# Patient Record
Sex: Male | Born: 1970
Health system: Southern US, Community
[De-identification: ages and names within clinical notes are randomized; demographics above are authoritative.]

## PROBLEM LIST (undated history)

## (undated) DIAGNOSIS — E222 Syndrome of inappropriate secretion of antidiuretic hormone: Secondary | ICD-10-CM

## (undated) DIAGNOSIS — E871 Hypo-osmolality and hyponatremia: Secondary | ICD-10-CM

## (undated) DIAGNOSIS — F17200 Nicotine dependence, unspecified, uncomplicated: Secondary | ICD-10-CM

## (undated) DIAGNOSIS — I10 Essential (primary) hypertension: Secondary | ICD-10-CM

## (undated) HISTORY — DX: Syndrome of inappropriate secretion of antidiuretic hormone: E22.2

## (undated) HISTORY — DX: Hypo-osmolality and hyponatremia: E87.1

## (undated) HISTORY — DX: Nicotine dependence, unspecified, uncomplicated: F17.200

## (undated) HISTORY — DX: Essential (primary) hypertension: I10

---

## 2003-04-20 ENCOUNTER — Emergency Department (HOSPITAL_COMMUNITY): Admission: EM | Admit: 2003-04-20 | Discharge: 2003-04-20 | Payer: Self-pay | Admitting: Emergency Medicine

## 2015-10-17 ENCOUNTER — Ambulatory Visit (INDEPENDENT_AMBULATORY_CARE_PROVIDER_SITE_OTHER): Payer: 59 | Admitting: Physician Assistant

## 2015-10-17 ENCOUNTER — Encounter: Payer: Self-pay | Admitting: Physician Assistant

## 2015-10-17 DIAGNOSIS — Z1211 Encounter for screening for malignant neoplasm of colon: Secondary | ICD-10-CM | POA: Insufficient documentation

## 2015-10-17 DIAGNOSIS — Z720 Tobacco use: Secondary | ICD-10-CM | POA: Diagnosis not present

## 2015-10-17 DIAGNOSIS — Z716 Tobacco abuse counseling: Secondary | ICD-10-CM | POA: Diagnosis not present

## 2015-10-17 DIAGNOSIS — F17200 Nicotine dependence, unspecified, uncomplicated: Secondary | ICD-10-CM

## 2015-10-17 MED ORDER — VARENICLINE TARTRATE 0.5 MG PO TABS: 0.5000 mg | ORAL_TABLET | Freq: Two times a day (BID) | ORAL | 0 refills | Status: DC

## 2015-10-17 MED ORDER — VARENICLINE TARTRATE 1 MG PO TABS: 1.0000 mg | ORAL_TABLET | Freq: Two times a day (BID) | ORAL | 3 refills | Status: DC

## 2015-10-17 NOTE — Progress Notes (Signed)
Patient ID: Patrick MallickDavid L Ramsey MRN: [id number], DOB: 1970/03/23, 45 y.o. Date of Encounter: 10/17/2015, 8:59 AM    Chief Complaint:  Chief Complaint  Patient presents with  . Establish Care    would like to stop smoking PHQ score-0     HPI: 45 y.o. year old white male presents with above.   Says he hasn't seen a doctor in over 10 years. He has no known chronic medical problems and takes no medications. Says that he came in today because he wants to quit smoking. Started smoking around age 45. For a while smoked about a pack a day but currently is 1-1/2 packs a day.  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 Says that one thing that will help him quit smoking is the fact that he cannot smoke at work and has to go to the smoking area. Says says that he already has bought some toothpicks and candy to help him. He has used no medications to help him quit. No type of nicotine replacement etc.  No other complaints or concerns today. Discussed doing a complete physical exam--- he does not have time in his schedule to do it today. To consider returning for complete physical exam and come fasting if so.  Did discuss family medical history briefly. His mother is living but does have a history of uterine cancer. Father is living but does have hypertension and borderline diabetes. No CAD.     Home Meds:   No outpatient prescriptions prior to visit.   No facility-administered medications prior to visit.     Allergies: Not on File    Review of Systems: See HPI for pertinent ROS. All other ROS negative.    Physical Exam: Blood pressure 130/86, pulse 88, temperature 97.7 F (36.5 C), temperature source Oral, resp. rate 16, height 5\' 10"  (1.778 m), weight 171 lb (77.6 kg)., Body mass index is 24.54 kg/m. General:  Appears in no acute distress. Neck: Supple. No thyromegaly. No lymphadenopathy. Lungs: Clear bilaterally to auscultation without wheezes, rales, or rhonchi. Breathing is  unlabored. Heart: Regular rhythm. No murmurs, rubs, or gallops. Msk:  Strength and tone normal for age. Extremities/Skin: Warm and dry. Neuro: Alert and oriented X 3. Moves all extremities spontaneously. Gait is normal. CNII-XII grossly in tact. Psych:  Responds to questions appropriately with a normal affect.     ASSESSMENT AND PLAN:  45 y.o. year old male with  1. Smoker - varenicline (CHANTIX) 0.5 MG tablet; Take 1 tablet (0.5 mg total) by mouth 2 (two) times daily.  Dispense: 60 tablet; Refill: 0 - varenicline (CHANTIX CONTINUING MONTH PAK) 1 MG tablet; Take 1 tablet (1 mg total) by mouth 2 (two) times daily.  Dispense: 60 tablet; Refill: 3  2. Encounter for smoking cessation counseling - varenicline (CHANTIX) 0.5 MG tablet; Take 1 tablet (0.5 mg total) by mouth 2 (two) times daily.  Dispense: 60 tablet; Refill: 0 - varenicline (CHANTIX CONTINUING MONTH PAK) 1 MG tablet; Take 1 tablet (1 mg total) by mouth 2 (two) times daily.  Dispense: 60 tablet; Refill: 3  Is to use the starting dose pack first then go up to the continuing Dosepaks. If he develops any adverse effects he is to stop the medication and call us immediately. Also I have given and reviewed handout with tips for cessation today and told him to read through these and see which of these may help him as well.  He is going to consider returning for complete  physical exam to check screening labs and immunizations etc.  Signed, [address] [place], [place], [facility] 10/17/2015 8:59 AM

## 2015-10-18 MED FILL — CHANTIX 0.5 MG TABLET: 0.5 | 30 days supply | Qty: 60 | Fill #0

## 2015-11-09 ENCOUNTER — Encounter: Payer: Self-pay | Admitting: Physician Assistant

## 2015-11-09 ENCOUNTER — Ambulatory Visit (INDEPENDENT_AMBULATORY_CARE_PROVIDER_SITE_OTHER): Payer: 59 | Admitting: Physician Assistant

## 2015-11-09 VITALS — BP 124/86 | HR 82 | Temp 97.7°F | Resp 16 | Wt 174.0 lb

## 2015-11-09 DIAGNOSIS — M79601 Pain in right arm: Secondary | ICD-10-CM

## 2015-11-09 MED ORDER — MELOXICAM 7.5 MG PO TABS: 7.5000 mg | ORAL_TABLET | Freq: Every day | ORAL | 0 refills | Status: DC

## 2015-11-09 MED FILL — MELOXICAM 7.5 MG TABLET: 7.5 | 30 days supply | Qty: 30 | Fill #0

## 2015-11-09 NOTE — Progress Notes (Signed)
Patient ID: Patrick Ramsey MRN: [id number], DOB: May 20, 1970, 45 y.o. Date of Encounter: 11/09/2015, 12:57 PM    Chief Complaint:  Chief Complaint  Patient presents with  . 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 Says that his right arm around the elbow area has been hurting for 1-1/2 months. Says that he was at the gym lifting weights and thought he had just pulled something as far as just pulling a muscle but says it still hurts so he decided to come get it evaluated. Says he only feels the discomfort with certain movements. Says that when he goes to lift the tailgate on his truck-- he feels a pain at the posterior aspect of the elbow. Points towards the olecranon process and just above this as the area where he feels discomfort at that time. Says that when he is doing movements such as using a screwdriver and having to do that motion against resistance--- at that time he feels discomfort in the anterior lateral aspect of the proximal forearm, up near (but medial to) the lateral epicondyle.  No pain when sitting at rest. No other complaints or concerns.     Home Meds:   Outpatient Medications Prior to Visit  Medication Sig Dispense Refill  . varenicline (CHANTIX CONTINUING MONTH PAK) 1 MG tablet Take 1 tablet (1 mg total) by mouth 2 (two) times daily. 60 tablet 3  . varenicline (CHANTIX) 0.5 MG tablet Take 1 tablet (0.5 mg total) by mouth 2 (two) times daily. 60 tablet 0   No facility-administered medications prior to visit.     Allergies: Not on File    Review of Systems: See HPI for pertinent ROS. All other ROS negative.    Physical Exam: Blood pressure 124/86, pulse 82, temperature 97.7 F (36.5 C), resp. rate 16, weight 174 lb (78.9 kg), SpO2 98 %., Body mass index is 24.97 kg/m. General: WNWD WM.  Appears in no acute distress. Neck: Supple. No thyromegaly. No lymphadenopathy. Lungs: Clear bilaterally  to auscultation without wheezes, rales, or rhonchi. Breathing is unlabored. Heart: Regular rhythm. No murmurs, rubs, or gallops. Msk:  Strength and tone normal for age. Right Elbow: Inspection is normal. Range of motion is normal. There is no tenderness with palpation to the olecranon process. There is no tenderness with palpation of the lateral epicondyle. There is no tenderness with palpation of the medial epicondyle. As well a palpated the other areas where he points to as the areas of discomfort with certain movements but there is no tenderness with palpation of any of these regions. Had him extend the arm and hyperextend the wrist and middle finger against resistance but this still causes no discomfort in the lateral epicondyle. Extremities/Skin: Warm and dry.  Neuro: Alert and oriented X 3. Moves all extremities spontaneously. Gait is normal. CNII-XII grossly in tact. Psych:  Responds to questions appropriately with a normal affect.     ASSESSMENT AND PLAN:  45 y.o. year old male with  1. Right arm pain --secondary to tendonitis and muscle strain He is to rest the right elbow and avoid using this is much as possible. He is to take metabolic daily with food for 2 - 3 weeks.  F/U if does not improve.  - meloxicam (MOBIC) 7.5 MG tablet; Take 1 tablet (7.5 mg total) by mouth daily.  Dispense: 30 tablet; Refill: 0   Signed,  [address] [place], [place], [place] 11/09/2015 12:57 PM

## 2015-11-11 MED FILL — CHANTIX 1 MG TABLET: 1 | 30 days supply | Qty: 60 | Fill #0

## 2016-03-12 ENCOUNTER — Ambulatory Visit (HOSPITAL_COMMUNITY)
Admission: RE | Admit: 2016-03-12 | Discharge: 2016-03-12 | Disposition: A | Discharge status: Home / Self Care | Payer: 59 | Source: Ambulatory Visit | Attending: Physician Assistant | Admitting: Physician Assistant

## 2016-03-12 ENCOUNTER — Encounter: Payer: Self-pay | Admitting: Physician Assistant

## 2016-03-12 ENCOUNTER — Ambulatory Visit (INDEPENDENT_AMBULATORY_CARE_PROVIDER_SITE_OTHER): Payer: 59 | Admitting: Physician Assistant

## 2016-03-12 VITALS — BP 124/80 | HR 93 | Temp 97.5°F | Resp 16 | Wt 170.4 lb

## 2016-03-12 DIAGNOSIS — N50811 Right testicular pain: Secondary | ICD-10-CM

## 2016-03-12 DIAGNOSIS — N451 Epididymitis: Secondary | ICD-10-CM

## 2016-03-12 DIAGNOSIS — N5089 Other specified disorders of the male genital organs: Secondary | ICD-10-CM

## 2016-03-12 DIAGNOSIS — N433 Hydrocele, unspecified: Secondary | ICD-10-CM | POA: Insufficient documentation

## 2016-03-12 MED ORDER — CEFTRIAXONE SODIUM 250 MG IJ SOLR
250.0000 mg | Freq: Once | INTRAMUSCULAR | Status: AC
  Administered 2016-03-12: 250 mg via INTRAMUSCULAR

## 2016-03-12 MED ORDER — DOXYCYCLINE HYCLATE 100 MG PO TABS: 100.0000 mg | ORAL_TABLET | Freq: Two times a day (BID) | ORAL | 0 refills | Status: DC

## 2016-03-12 MED FILL — DOXYCYCLINE HYCLATE 100 MG: 100 | 10 days supply | Qty: 20 | Fill #0

## 2016-03-12 NOTE — Progress Notes (Signed)
Patient ID: Patrick Ramsey MRN: [id number], DOB: 1970/02/18, 46 y.o. Date of Encounter: 03/12/2016, 11:09 AM    Chief Complaint:  Chief Complaint  Patient presents with  . right testicular swollen    x4days/ worked out Wednesday night     HPI: 45 y.o. year old male presents with above.   He states that he exercised Wednesday night and felt okay during that exercise and noticed no symptoms during that time. Says that on Thursday night he went to work and noticed that he felt sore in the right groin region. At time his right testicle also felt a little bit tender. This weekend his right testicle became swollen and right testicle became really sore. Has been taking some Tylenol. Looked it up on the Internet and read that it could be caused by infection--so he started taking amoxicillin (that he had some antibiotic amoxicillin at home left from her prior infection)--so he took amoxicillin---  has completed what he had of the amoxicillin. Right testicle still swollen and painful. No fevers or chills.  Has seen no blood. No penile discharge. Reports that he is not married.      Home Meds:   Outpatient Medications Prior to Visit  Medication Sig Dispense Refill  . meloxicam (MOBIC) 7.5 MG tablet Take 1 tablet (7.5 mg total) by mouth daily. (Patient not taking: Reported on 03/12/2016) 30 tablet 0  . varenicline (CHANTIX CONTINUING MONTH PAK) 1 MG tablet Take 1 tablet (1 mg total) by mouth 2 (two) times daily. (Patient not taking: Reported on 03/12/2016) 60 tablet 3  . varenicline (CHANTIX) 0.5 MG tablet Take 1 tablet (0.5 mg total) by mouth 2 (two) times daily. (Patient not taking: Reported on 03/12/2016) 60 tablet 0   No facility-administered medications prior to visit.     Allergies: No Known Allergies    Review of Systems: See HPI for pertinent ROS. All other ROS negative.    Physical Exam: Blood pressure 124/80, pulse 93, temperature 97.5 F (36.4 C), temperature source Oral, resp. rate  16, weight 170 lb 6.4 oz (77.3 kg), SpO2 99 %., Body mass index is 24.45 kg/m. General:  WNWD WM. Appears in no acute distress. Neck: Supple. No thyromegaly. No lymphadenopathy. Lungs: Clear bilaterally to auscultation without wheezes, rales, or rhonchi. Breathing is unlabored. Heart: Regular rhythm. No murmurs, rubs, or gallops. Msk:  Strength and tone normal for age. Extremities/Skin: Warm and dry. No rashes. Testicular Exam: Patrick Ramsey, CMA present as chaperone during exam: He reports tenderness with palpation of entire right testicle--posterior aspect, anterior aspect, sides. Right testicle with minimal swolling, minimal erythema. No lesion seen on inspection. No other abnormality detected on exam.  Neuro: Alert and oriented X 3. Moves all extremities spontaneously. Gait is normal. CNII-XII grossly in tact. Psych:  Responds to questions appropriately with a normal affect.     ASSESSMENT AND PLAN:  46 y.o. year old male with   1. Epididymitis --Rocephin 250mg  IM - doxycycline (VIBRA-TABS) 100 MG tablet; Take 1 tablet (100 mg total) by mouth 2 (two) times daily.  Dispense: 20 tablet; Refill: 0  2. Right testicular pain - US Scrotum; Future - [place] Art/Ven Flow Abd Pelv Doppler; Future  3. Swelling of right testicle - US Scrotum; Future - [place] Art/Ven Flow Abd Pelv Doppler; Future  Pt was sent directly for scrotum ultrasound test.   Results already available and I have called patient and spoken with him directly and informed him of results. He is coming directly  to the office for injection of Rocephin 250 mg IM.  As well---he will take oral doxycycline 100 mg twice a day 10 days.  He has come into the office. I have discussed results with him directly. Discussed with him that this usually is caused by sexual transmitted disease. I am treating him with medications to cover these. However, he needs to inform any recent sexual partners and they need to be evaluated and treated. He is  to avoid any sexual contact until he has completed his treatment and they have completed their treatment. He voices understanding and agrees. Given the Rocephin 250 mg IM here in the office and a prescription for the doxycycline is sent to the pharmacy and he is aware to take as directed and complete all of it.  Signed, [address]Patrick Ramsey, [place], [facility] 03/12/2016 11:09 AM

## 2016-03-12 NOTE — Addendum Note (Signed)
Addended by: Phineas SemenJOHNSON, TIFFANY A on: 03/12/2016 05:06 PM   Modules accepted: Orders

## 2016-07-23 ENCOUNTER — Ambulatory Visit (INDEPENDENT_AMBULATORY_CARE_PROVIDER_SITE_OTHER): Payer: 59 | Admitting: Physician Assistant

## 2016-07-23 ENCOUNTER — Encounter: Payer: Self-pay | Admitting: Physician Assistant

## 2016-07-23 VITALS — BP 122/86 | HR 93 | Temp 97.5°F | Resp 16 | Ht 70.0 in | Wt 170.2 lb

## 2016-07-23 DIAGNOSIS — N529 Male erectile dysfunction, unspecified: Secondary | ICD-10-CM | POA: Diagnosis not present

## 2016-07-23 NOTE — Progress Notes (Signed)
    Patient ID: Patrick MallickDavid L Ramsey MRN: [id number], DOB: 08/21/1970, 46 y.o. Date of Encounter: 07/23/2016, 4:55 PM    Chief Complaint:  Chief Complaint  Patient presents with  . Erectile Dysfunction    x832months      HPI: 46 y.o. year old male presents with above.   He states that he has been having some erectile dysfunction that has been gradually worsening over the approximate past 6 months. He states that he has never had this evaluated and has never had any medication for this. Asked if he has any known history of high blood pressure high cholesterol diabetes or history of low testosterone. He states that he has never had any of these checked. No complete physical exam no lab work. He is not fasting today. He works third shift so works nights but states that he is off this upcoming weekend can come in this Monday morning fasting and for visit. [name] scheduling that visit as a complete physical exam and he is agreeable with this plan and this approach. No other concerns to address today.     Home Meds:   Outpatient Medications Prior to Visit  Medication Sig Dispense Refill  . doxycycline (VIBRA-TABS) 100 MG tablet Take 1 tablet (100 mg total) by mouth 2 (two) times daily. 20 tablet 0  . meloxicam (MOBIC) 7.5 MG tablet Take 1 tablet (7.5 mg total) by mouth daily. (Patient not taking: Reported on 03/12/2016) 30 tablet 0  . varenicline (CHANTIX CONTINUING MONTH PAK) 1 MG tablet Take 1 tablet (1 mg total) by mouth 2 (two) times daily. (Patient not taking: Reported on 03/12/2016) 60 tablet 3  . varenicline (CHANTIX) 0.5 MG tablet Take 1 tablet (0.5 mg total) by mouth 2 (two) times daily. (Patient not taking: Reported on 03/12/2016) 60 tablet 0   No facility-administered medications prior to visit.     Allergies: No Known Allergies    Review of Systems: See HPI for pertinent ROS. All other ROS negative.    Physical Exam: Blood pressure 122/86, pulse 93, temperature 97.5 F (36.4 C),  temperature source Oral, resp. rate 16, height 5\' 10"  (1.778 m), weight 170 lb 3.2 oz (77.2 kg), SpO2 98 %., Body mass index is 24.42 kg/m. General:  WNWD WM. Appears in no acute distress. Neck: Supple. No thyromegaly. No lymphadenopathy. Lungs: Clear bilaterally to auscultation without wheezes, rales, or rhonchi. Breathing is unlabored. Heart: Regular rhythm. No murmurs, rubs, or gallops. Msk:  Strength and tone normal for age. Extremities/Skin: Warm and dry. Neuro: Alert and oriented X 3. Moves all extremities spontaneously. Gait is normal. CNII-XII grossly in tact. Psych:  Responds to questions appropriately with a normal affect.     ASSESSMENT AND PLAN:  46 y.o. year old male with  1. Erectile dysfunction, unspecified erectile dysfunction type He will return on Monday, June 25 for complete physical exam at 9 AM. He will come fasting to that appointment. At that time I will check screening labs and also testosterone level to further evaluate ED.   [address]igned, [place] Beth [place], [place], [facility] 07/23/2016 4:55 PM

## 2016-07-30 ENCOUNTER — Ambulatory Visit (INDEPENDENT_AMBULATORY_CARE_PROVIDER_SITE_OTHER): Payer: 59 | Admitting: Physician Assistant

## 2016-07-30 ENCOUNTER — Encounter: Payer: Self-pay | Admitting: Physician Assistant

## 2016-07-30 VITALS — BP 130/88 | HR 96 | Temp 97.9°F | Resp 16 | Ht 70.0 in | Wt 170.0 lb

## 2016-07-30 DIAGNOSIS — Z0000 Encounter for general adult medical examination without abnormal findings: Secondary | ICD-10-CM | POA: Diagnosis not present

## 2016-07-30 DIAGNOSIS — N529 Male erectile dysfunction, unspecified: Secondary | ICD-10-CM | POA: Diagnosis not present

## 2016-07-30 DIAGNOSIS — F17200 Nicotine dependence, unspecified, uncomplicated: Secondary | ICD-10-CM | POA: Diagnosis not present

## 2016-07-30 LAB — CBC WITH DIFFERENTIAL/PLATELET
BASOS ABS: 48 {cells}/uL (ref 0–200)
Basophils Relative: 1 %
EOS ABS: 96 {cells}/uL (ref 15–500)
Eosinophils Relative: 2 %
HEMATOCRIT: 50 % (ref 38.5–50.0)
Hemoglobin: 17.4 g/dL — ABNORMAL HIGH (ref 13.0–17.0)
Lymphocytes Relative: 30 %
Lymphs Abs: 1440 cells/uL (ref 850–3900)
MCH: 31.9 pg (ref 27.0–33.0)
MCHC: 34.8 g/dL (ref 32.0–36.0)
MCV: 91.7 fL (ref 80.0–100.0)
MONO ABS: 528 {cells}/uL (ref 200–950)
MONOS PCT: 11 %
MPV: 9.5 fL (ref 7.5–12.5)
NEUTROS ABS: 2688 {cells}/uL (ref 1500–7800)
Neutrophils Relative %: 56 %
PLATELETS: 244 10*3/uL (ref 140–400)
RBC: 5.45 MIL/uL (ref 4.20–5.80)
RDW: 13.4 % (ref 11.0–15.0)
WBC: 4.8 10*3/uL (ref 3.8–10.8)

## 2016-07-30 LAB — COMPLETE METABOLIC PANEL WITH GFR
ALBUMIN: 4.6 g/dL (ref 3.6–5.1)
ALK PHOS: 90 U/L (ref 40–115)
ALT: 33 U/L (ref 9–46)
AST: 29 U/L (ref 10–40)
BILIRUBIN TOTAL: 0.5 mg/dL (ref 0.2–1.2)
BUN: 11 mg/dL (ref 7–25)
CALCIUM: 10 mg/dL (ref 8.6–10.3)
CO2: 23 mmol/L (ref 20–31)
CREATININE: 1.2 mg/dL (ref 0.60–1.35)
Chloride: 100 mmol/L (ref 98–110)
GFR, EST AFRICAN AMERICAN: 85 mL/min (ref >=60)
GFR, EST NON AFRICAN AMERICAN: 73 mL/min (ref >=60)
Glucose, Bld: 96 mg/dL (ref 70–99)
Potassium: 5 mmol/L (ref 3.5–5.3)
Sodium: 135 mmol/L (ref 135–146)
TOTAL PROTEIN: 7.2 g/dL (ref 6.1–8.1)

## 2016-07-30 LAB — LIPID PANEL
CHOLESTEROL: 215 mg/dL — AB (ref <200)
HDL: 53 mg/dL (ref >40)
LDL Cholesterol: 139 mg/dL — ABNORMAL HIGH (ref <100)
TRIGLYCERIDES: 113 mg/dL (ref <150)
Total CHOL/HDL Ratio: 4.1 Ratio (ref <5.0)
VLDL: 23 mg/dL (ref <30)

## 2016-07-30 LAB — TSH: TSH: 1.14 mIU/L (ref 0.40–4.50)

## 2016-07-30 MED ORDER — SILDENAFIL CITRATE 100 MG PO TABS: 50.0000 mg | ORAL_TABLET | Freq: Every day | ORAL | 11 refills | Status: DC | PRN

## 2016-07-30 MED FILL — SILDENAFIL 100 MG TABLET: 100 | 30 days supply | Qty: 6 | Fill #0
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
## 2016-07-31 LAB — TESTOSTERONE: Testosterone: 755 ng/dL (ref 250–827)

## 2016-09-04 MED FILL — SILDENAFIL 100 MG TABLET: 100 | 30 days supply | Qty: 6 | Fill #1

## 2016-10-10 MED FILL — SILDENAFIL CITRATE 100 MG T: 100 | 30 days supply | Qty: 6 | Fill #2

## 2016-11-12 MED FILL — SILDENAFIL CITRATE 100 MG T: 100 | 30 days supply | Qty: 6 | Fill #3

## 2016-12-06 MED FILL — SILDENAFIL CITRATE 100 MG T: 100 | 30 days supply | Qty: 6 | Fill #4

## 2017-01-11 MED FILL — SILDENAFIL CITRATE 100 MG T: 100 | 30 days supply | Qty: 6 | Fill #5

## 2017-02-07 MED FILL — SILDENAFIL CITRATE 100 MG T: 100 | 30 days supply | Qty: 6 | Fill #6

## 2017-03-13 MED FILL — SILDENAFIL CITRATE 100 MG T: 100 | 30 days supply | Qty: 6 | Fill #7

## 2017-04-15 MED FILL — SILDENAFIL CITRATE 100 MG T: 100 | 30 days supply | Qty: 6 | Fill #8

## 2017-05-15 MED FILL — SILDENAFIL CITRATE 100 MG T: 100 | 30 days supply | Qty: 6 | Fill #9

## 2017-06-21 ENCOUNTER — Other Ambulatory Visit: Payer: Self-pay | Admitting: Physician Assistant

## 2017-06-21 DIAGNOSIS — N529 Male erectile dysfunction, unspecified: Secondary | ICD-10-CM

## 2017-06-21 MED FILL — SILDENAFIL CITRATE 100 MG T: 100 | 30 days supply | Qty: 6 | Fill #0

## 2017-06-26 ENCOUNTER — Ambulatory Visit (INDEPENDENT_AMBULATORY_CARE_PROVIDER_SITE_OTHER): Payer: No Typology Code available for payment source | Admitting: Family Medicine

## 2017-06-26 ENCOUNTER — Encounter: Payer: Self-pay | Admitting: Family Medicine

## 2017-06-26 ENCOUNTER — Other Ambulatory Visit: Payer: Self-pay

## 2017-06-26 VITALS — BP 120/70 | HR 81 | Temp 97.5°F | Ht 70.0 in | Wt 177.0 lb

## 2017-06-26 DIAGNOSIS — H00011 Hordeolum externum right upper eyelid: Secondary | ICD-10-CM | POA: Diagnosis not present

## 2017-06-26 DIAGNOSIS — J019 Acute sinusitis, unspecified: Secondary | ICD-10-CM | POA: Diagnosis not present

## 2017-06-26 MED ORDER — ERYTHROMYCIN 5 MG/GM OP OINT: TOPICAL_OINTMENT | OPHTHALMIC | 0 refills | Status: DC

## 2017-06-26 MED ORDER — MOMETASONE FUROATE 50 MCG/ACT NA SUSP: 2.0000 | Freq: Every day | NASAL | 12 refills | Status: DC

## 2017-06-26 MED FILL — MOMETASONE FUROATE 50 MCG S: 50 | 30 days supply | Qty: 17 | Fill #0

## 2017-06-26 MED FILL — ERYTHROMYCIN 0.5% EYE OINT: 5 | 7 days supply | Qty: 1 | Fill #0

## 2017-06-26 NOTE — Progress Notes (Signed)
Patient ID: Patrick Ramsey, male    DOB: 1970-11-16, 47 y.o.   MRN: [id number]  PCP: Dorena Bodo, PA-C  Chief Complaint  Patient presents with  . Facial Swelling    Patient in with c/o eye swelling and soreness.    Subjective:   Patrick Ramsey is a 47 y.o. male, presents to clinic with CC of right upper eyelid swelling x 3 weeks that gradually improved, but not yet resolved.  It started with a bump to the outside upper right eyelid and gradually had swelling across the whole upper lid.  He only has mild discomfort to the inner right eye if he presses on it, mild discomfort, feels "full" or inflammed.  He used OTC red eye drops and allergy eye drops w/o any change, and only had upper eyelid swelling improve after using someones steroid eye drops.  The small bump to the He only put in 2 drops into his right eye on two separate occassions this past week.  No associated eye redness, eye discharge or crusting, blurry vision, photophobia, HA, nasal congestion/discharge, sneezing, itching, rash.  Patient Active Problem List   Diagnosis Date Noted  . Smoker 10/17/2015  . Encounter for smoking cessation counseling 10/17/2015     Prior to Admission medications   Medication Sig Start Date End Date Taking? Authorizing Provider  sildenafil (VIAGRA) 100 MG tablet TAKE 1/2 TO 1 TABLET (50-100 MG TOTAL) BY MOUTH DAILY AS NEEDED FOR ERECTILE DYSFUNCTION. 06/21/17  Yes Allayne Butcher B, PA-C     No Known Allergies   Family History  Problem Relation Age of Onset  . Uterine cancer Mother   . Hypertension Father      Social History   Socioeconomic History  . Marital status: Married    Spouse name: Not on file  . Number of children: Not on file  . Years of education: Not on file  . Highest education level: Not on file  Occupational History  . Not on file  Social Needs  . Financial resource strain: Not on file  . Food insecurity:    Worry: Not on file    Inability: Not on file  .  Transportation needs:    Medical: Not on file    Non-medical: Not on file  Tobacco Use  . Smoking status: Current Every Day Smoker    Packs/day: 1.50    Years: 27.00    Pack years: 40.50    Types: Cigarettes    Start date: 10/17/1986  . Smokeless tobacco: Never Used  Substance and Sexual Activity  . Alcohol use: Yes    Alcohol/week: 7.2 oz    Types: 12 Cans of beer per week    Comment: 12 cans on weekend  . Drug use: No  . Sexual activity: Yes  Lifestyle  . Physical activity:    Days per week: Not on file    Minutes per session: Not on file  . Stress: Not on file  Relationships  . Social connections:    Talks on phone: Not on file    Gets together: Not on file    Attends religious service: Not on file    Active member of club or organization: Not on file    Attends meetings of clubs or organizations: Not on file    Relationship status: Not on file  . Intimate partner violence:    Fear of current or ex partner: Not on file    Emotionally abused: Not on file  Physically abused: Not on file    Forced sexual activity: Not on file  Other Topics Concern  . Not on file  Social History Narrative  . Not on file     Review of Systems  Constitutional: Negative.  Negative for activity change, chills, diaphoresis, fatigue and fever.  HENT: Negative.   Eyes: Negative for photophobia, discharge, redness, itching and visual disturbance.  Respiratory: Negative.  Negative for cough.   Musculoskeletal: Negative.   Skin: Negative for pallor and rash.  Allergic/Immunologic: Negative for environmental allergies and immunocompromised state.  Neurological: Negative.   All other systems reviewed and are negative.      Objective:    Vitals:   06/26/17 0854  BP: 120/70  Pulse: 81  Temp: (!) 97.5 F (36.4 C)  TempSrc: Oral  SpO2: 99%  Weight: 177 lb (80.3 kg)  Height:  (1.778 m)      Physical Exam  Constitutional: He appears well-developed and well-nourished. No  distress.  HENT:  Head: Normocephalic and atraumatic. Head is without right periorbital erythema and without left periorbital erythema.  Right Ear: Tympanic membrane, external ear and ear canal normal.  Left Ear: Tympanic membrane, external ear and ear canal normal.  Nose: Mucosal edema and rhinorrhea present. No sinus tenderness. Right sinus exhibits no maxillary sinus tenderness and no frontal sinus tenderness. Left sinus exhibits no maxillary sinus tenderness and no frontal sinus tenderness.  Mouth/Throat: Uvula is midline and mucous membranes are normal. No trismus in the jaw. No uvula swelling. Posterior oropharyngeal erythema present. No oropharyngeal exudate, posterior oropharyngeal edema or tonsillar abscesses.  Eyes: Pupils are equal, round, and reactive to light. Conjunctivae and EOM are normal. Lids are everted and swept, no foreign bodies found. Right eye exhibits hordeolum. Right eye exhibits no chemosis, no discharge and no exudate. No foreign body present in the right eye. Left eye exhibits no chemosis, no discharge, no exudate and no hordeolum. No foreign body present in the left eye. Right conjunctiva is not injected. Right conjunctiva has no hemorrhage. Left conjunctiva is not injected. Left conjunctiva has no hemorrhage. No scleral icterus. Right eye exhibits normal extraocular motion and no nystagmus. Left eye exhibits normal extraocular motion and no nystagmus.  0.5 cm diameter circular soft nodule to right upper lateral eyelid, non-tender to palpation, no surrounding edema, erythema, induration, tenderness to right upper eye lid ttp to right medial canthus, no edema, no discharge, no erythema, no drainage  Right lower, left upper and left lower eyelids normal  Neck: Normal range of motion. No tracheal deviation present.  Cardiovascular: Normal rate and regular rhythm.  Pulmonary/Chest: Effort normal. No stridor. No respiratory distress.  Musculoskeletal: Normal range of motion.    Lymphadenopathy:    He has no cervical adenopathy.  Neurological: He is alert. He exhibits normal muscle tone. Coordination normal.  Skin: Skin is warm and dry. No rash noted. He is not diaphoretic. No erythema.  Psychiatric: He has a normal mood and affect. His behavior is normal.  Nursing note and vitals reviewed.         Assessment & Plan:      ICD-10-CM   1. Hordeolum of right upper eyelid, unspecified hordeolum type H00.011 erythromycin ophthalmic ointment  2. Acute sinusitis, recurrence not specified, unspecified location J01.90 mometasone (NASONEX) 50 MCG/ACT nasal spray    Likely a simple hordeolum, no concern for orbital cellulitis, no concern for eye infections.  Will have pt do warm compresses 2-3 x a day, gently exfoliate eyelashes  and upper eyelid, apply antibiotic ointment to inner margin of right upper eyelid and lash line for 5-7 days.  F/up if not improved.  Also has likely some baseline seasonal allergies with nasal edema and mild erythema, will tx with nasonex.   Danelle Berry, PA-C 06/27/17 11:06 PM

## 2017-06-26 NOTE — Patient Instructions (Signed)
Warm soaks for 10-15 minutes, 2 x a day  Gentle exfoliation of eyelashes 2 x a day

## 2017-07-17 MED FILL — SILDENAFIL CITRATE 100 MG T: 100 | 30 days supply | Qty: 6 | Fill #1

## 2017-08-01 ENCOUNTER — Other Ambulatory Visit: Payer: Self-pay

## 2017-08-01 ENCOUNTER — Other Ambulatory Visit: Payer: Self-pay | Admitting: Family Medicine

## 2017-08-01 ENCOUNTER — Ambulatory Visit (INDEPENDENT_AMBULATORY_CARE_PROVIDER_SITE_OTHER): Payer: No Typology Code available for payment source | Admitting: Physician Assistant

## 2017-08-01 ENCOUNTER — Encounter: Payer: Self-pay | Admitting: Physician Assistant

## 2017-08-01 VITALS — BP 130/80 | HR 67 | Temp 97.8°F | Resp 16 | Ht 68.0 in | Wt 181.0 lb

## 2017-08-01 DIAGNOSIS — Z0000 Encounter for general adult medical examination without abnormal findings: Secondary | ICD-10-CM

## 2017-08-01 DIAGNOSIS — S83102A Unspecified subluxation of left knee, initial encounter: Secondary | ICD-10-CM | POA: Diagnosis not present

## 2017-08-01 DIAGNOSIS — F17200 Nicotine dependence, unspecified, uncomplicated: Secondary | ICD-10-CM

## 2017-08-01 DIAGNOSIS — Z716 Tobacco abuse counseling: Secondary | ICD-10-CM | POA: Diagnosis not present
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
## 2017-08-12 ENCOUNTER — Other Ambulatory Visit: Payer: No Typology Code available for payment source

## 2017-08-12 DIAGNOSIS — Z0000 Encounter for general adult medical examination without abnormal findings: Secondary | ICD-10-CM

## 2017-08-12 LAB — CBC WITH DIFFERENTIAL/PLATELET
Basophils Absolute: 68 cells/uL (ref 0–200)
Basophils Relative: 1 %
EOS PCT: 1.9 %
Eosinophils Absolute: 129 cells/uL (ref 15–500)
HCT: 47.2 % (ref 38.5–50.0)
Hemoglobin: 16.6 g/dL (ref 13.2–17.1)
Lymphs Abs: 1496 cells/uL (ref 850–3900)
MCH: 31.6 pg (ref 27.0–33.0)
MCHC: 35.2 g/dL (ref 32.0–36.0)
MCV: 89.7 fL (ref 80.0–100.0)
MPV: 9.9 fL (ref 7.5–12.5)
Monocytes Relative: 11.7 %
NEUTROS PCT: 63.4 %
Neutro Abs: 4311 cells/uL (ref 1500–7800)
Platelets: 268 10*3/uL (ref 140–400)
RBC: 5.26 10*6/uL (ref 4.20–5.80)
RDW: 12.2 % (ref 11.0–15.0)
Total Lymphocyte: 22 %
WBC mixed population: 796 cells/uL (ref 200–950)
WBC: 6.8 10*3/uL (ref 3.8–10.8)

## 2017-08-12 LAB — COMPREHENSIVE METABOLIC PANEL
AG Ratio: 1.9 (calc) (ref 1.0–2.5)
ALBUMIN MSPROF: 4.5 g/dL (ref 3.6–5.1)
ALKALINE PHOSPHATASE (APISO): 92 U/L (ref 40–115)
ALT: 29 U/L (ref 9–46)
AST: 30 U/L (ref 10–40)
BUN: 9 mg/dL (ref 7–25)
CALCIUM: 9.5 mg/dL (ref 8.6–10.3)
CHLORIDE: 97 mmol/L — AB (ref 98–110)
CO2: 25 mmol/L (ref 20–32)
CREATININE: 1.06 mg/dL (ref 0.60–1.35)
Globulin: 2.4 g/dL (calc) (ref 1.9–3.7)
Glucose, Bld: 104 mg/dL — ABNORMAL HIGH (ref 65–99)
POTASSIUM: 4.9 mmol/L (ref 3.5–5.3)
Sodium: 132 mmol/L — ABNORMAL LOW (ref 135–146)
Total Bilirubin: 0.5 mg/dL (ref 0.2–1.2)
Total Protein: 6.9 g/dL (ref 6.1–8.1)

## 2017-08-12 LAB — LIPID PANEL
Cholesterol: 210 mg/dL — ABNORMAL HIGH (ref <200)
HDL: 63 mg/dL (ref >40)
LDL Cholesterol (Calc): 131 mg/dL (calc) — ABNORMAL HIGH
NON-HDL CHOLESTEROL (CALC): 147 mg/dL — AB (ref <130)
TRIGLYCERIDES: 65 mg/dL (ref <150)
Total CHOL/HDL Ratio: 3.3 (calc) (ref <5.0)

## 2017-08-16 MED FILL — SILDENAFIL CITRATE 100 MG T: 100 | 30 days supply | Qty: 6 | Fill #2

## 2017-08-16 MED FILL — MOMETASONE FUROATE 50 MCG S: 50 | 30 days supply | Qty: 17 | Fill #1

## 2017-08-20 ENCOUNTER — Ambulatory Visit: Payer: No Typology Code available for payment source | Admitting: Orthopaedic Surgery

## 2017-08-20 ENCOUNTER — Encounter: Payer: Self-pay | Admitting: Orthopaedic Surgery

## 2017-08-20 ENCOUNTER — Telehealth: Payer: Self-pay | Admitting: Radiology

## 2017-08-20 ENCOUNTER — Ambulatory Visit (INDEPENDENT_AMBULATORY_CARE_PROVIDER_SITE_OTHER): Payer: No Typology Code available for payment source

## 2017-08-20 VITALS — BP 160/99 | HR 76 | Ht 70.0 in | Wt 181.0 lb

## 2017-08-20 DIAGNOSIS — G8929 Other chronic pain: Secondary | ICD-10-CM | POA: Diagnosis not present

## 2017-08-20 DIAGNOSIS — M25562 Pain in left knee: Secondary | ICD-10-CM

## 2017-08-20 NOTE — Progress Notes (Signed)
Subjective:    Patient ID: Patrick Ramsey, male    DOB: 07-15-1970, 47 y.o.   MRN: [id number]  HPI He has had problems with his left knee for three years.  He has locking of the left knee.  He has to stop what he is doing, then sit, and gently try to move the knee.  It is very painful.  It happens about every three months.  He thought it would get better but it has not.  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  He has swelling of the knee when it happens but the swelling goes away in a few days.  He has tendency for it to buckle at times.  He has not taken anything for it.  He has not had any trauma.   Review of Systems  Constitutional: Positive for activity change.  Musculoskeletal: Positive for arthralgias, gait problem and joint swelling.  All other systems reviewed and are negative.  For Review of Systems, all other systems reviewed and are negative.  History reviewed. No pertinent past medical history.  History reviewed. No pertinent surgical history.  Current Outpatient Medications on File Prior to Visit  Medication Sig Dispense Refill  . mometasone (NASONEX) 50 MCG/ACT nasal spray Place 2 sprays into the nose daily. 17 g 12  . sildenafil (VIAGRA) 100 MG tablet TAKE 1/2 TO 1 TABLET (50-100 MG TOTAL) BY MOUTH DAILY AS NEEDED FOR ERECTILE DYSFUNCTION. 5 tablet 11  . varenicline (CHANTIX) 1 MG tablet Take 1 mg by mouth 2 (two) times daily.     No current facility-administered medications on file prior to visit.     Social History   Socioeconomic History  . Marital status: Married    Spouse name: Not on file  . Number of children: Not on file  . Years of education: Not on file  . Highest education level: Not on file  Occupational History  . Not on file  Social Needs  . Financial resource strain: Not on file  . Food insecurity:    Worry: Not on file    Inability: Not on file  . Transportation needs:    Medical: Not on file    Non-medical: Not  on file  Tobacco Use  . Smoking status: Current Every Day Smoker    Packs/day: 1.50    Years: 27.00    Pack years: 40.50    Types: Cigarettes    Start date: 10/17/1986  . Smokeless tobacco: Never Used  Substance and Sexual Activity  . Alcohol use: Yes    Alcohol/week: 7.2 oz    Types: 12 Cans of beer per week    Comment: 12 cans on weekend  . Drug use: No  . Sexual activity: Yes  Lifestyle  . Physical activity:    Days per week: Not on file    Minutes per session: Not on file  . Stress: Not on file  Relationships  . Social connections:    Talks on phone: Not on file    Gets together: Not on file    Attends religious service: Not on file    Active member of club or organization: Not on file    Attends meetings of clubs or organizations: Not on file    Relationship status: Not on file  . Intimate partner violence:    Fear of current or ex partner: Not on file    Emotionally abused: Not on file    Physically abused: Not  on file    Forced sexual activity: Not on file  Other Topics Concern  . Not on file  Social History Narrative  . Not on file    Family History  Problem Relation Age of Onset  . Uterine cancer Mother   . Hypertension Father     BP (!) 160/99   Pulse 76   Ht 5\' 10"  (1.778 m)   Wt 181 lb (82.1 kg)   BMI 25.97 kg/m   Body mass index is 25.97 kg/m.      Objective:   Physical Exam  Constitutional: He is oriented to person, place, and time. He appears well-developed and well-nourished.  HENT:  Head: Normocephalic and atraumatic.  Eyes: Pupils are equal, round, and reactive to light. Conjunctivae and EOM are normal.  Neck: Normal range of motion. Neck supple.  Cardiovascular: Normal rate, regular rhythm and intact distal pulses.  Pulmonary/Chest: Effort normal.  Abdominal: Soft.  Musculoskeletal:       Left knee: Tenderness found. Medial joint line tenderness noted.       Legs: Neurological: He is alert and oriented to person, place, and  time. He has normal reflexes. He displays normal reflexes. No cranial nerve deficit. He exhibits normal muscle tone. Coordination normal.  Skin: Skin is warm and dry.  Psychiatric: He has a normal mood and affect. His behavior is normal. Judgment and thought content normal.    X-rays were done of the left knee, reported separately.      Assessment & Plan:   Encounter Diagnosis  Name Primary?  . Chronic pain of left knee Yes   I feel he has a tear of the medial meniscus.  He needs MRI and then surgery, arthroscopy.  I will schedule the MRI.  Return after the MRI.  Call if any problem.  Precautions discussed.

## 2017-08-20 NOTE — Telephone Encounter (Signed)
Called for prior auth of MIR scan date specific for Monday July 22nd at 5pm  [id number] is auth number. Patient is aware of the time/date of the MRI

## 2017-08-26 ENCOUNTER — Ambulatory Visit (HOSPITAL_COMMUNITY)
Admission: RE | Admit: 2017-08-26 | Discharge: 2017-08-26 | Disposition: A | Discharge status: Home / Self Care | Payer: No Typology Code available for payment source | Source: Ambulatory Visit | Attending: Orthopaedic Surgery | Admitting: Orthopaedic Surgery

## 2017-08-26 DIAGNOSIS — M25562 Pain in left knee: Secondary | ICD-10-CM

## 2017-08-26 DIAGNOSIS — X58XXXA Exposure to other specified factors, initial encounter: Secondary | ICD-10-CM | POA: Diagnosis not present

## 2017-08-26 DIAGNOSIS — G8929 Other chronic pain: Secondary | ICD-10-CM | POA: Diagnosis present

## 2017-08-26 DIAGNOSIS — S83242A Other tear of medial meniscus, current injury, left knee, initial encounter: Secondary | ICD-10-CM | POA: Diagnosis not present

## 2017-08-28 ENCOUNTER — Ambulatory Visit: Payer: No Typology Code available for payment source | Admitting: Orthopaedic Surgery

## 2017-08-28 ENCOUNTER — Encounter: Payer: Self-pay | Admitting: Orthopaedic Surgery

## 2017-08-28 VITALS — BP 141/78 | HR 71 | Ht 70.0 in | Wt 181.0 lb

## 2017-08-28 DIAGNOSIS — G8929 Other chronic pain: Secondary | ICD-10-CM

## 2017-08-28 DIAGNOSIS — M25562 Pain in left knee: Secondary | ICD-10-CM

## 2017-08-28 NOTE — Progress Notes (Signed)
Patient Patrick Ramsey:Dhilan L Bond, male DOB:1970/08/03, 47 y.o. AVW:[id number]  Chief Complaint  Patient presents with  . Knee Pain    MRI results    HPI  Milus MallickDavid L Hopke is a 47 y.o. male who has pain of the left knee with swelling and giving way.  He had a MRI which showed: IMPRESSION: 1. Peripheral longitudinal tear of the medial meniscus body and posterior horn.  I have explained the findings to him and used a knee model. I have recommended he see Dr. Romeo AppleHarrison for knee arthroscopy.  He is agreeable to this.   Body mass index is 25.97 kg/m.  ROS  Review of Systems  Constitutional: Positive for activity change.  Musculoskeletal: Positive for arthralgias, gait problem and joint swelling.  All other systems reviewed and are negative.   All other systems reviewed and are negative.  History reviewed. No pertinent past medical history.  History reviewed. No pertinent surgical history.  Family History  Problem Relation Age of Onset  . Uterine cancer Mother   . Hypertension Father     Social History Social History   Tobacco Use  . Smoking status: Current Every Day Smoker    Packs/day: 1.50    Years: 27.00    Pack years: 40.50    Types: Cigarettes    Start date: 10/17/1986  . Smokeless tobacco: Never Used  Substance Use Topics  . Alcohol use: Yes    Alcohol/week: 7.2 oz    Types: 12 Cans of beer per week    Comment: 12 cans on weekend  . Drug use: No    No Known Allergies  Current Outpatient Medications  Medication Sig Dispense Refill  . mometasone (NASONEX) 50 MCG/ACT nasal spray Place 2 sprays into the nose daily. 17 g 12  . sildenafil (VIAGRA) 100 MG tablet TAKE 1/2 TO 1 TABLET (50-100 MG TOTAL) BY MOUTH DAILY AS NEEDED FOR ERECTILE DYSFUNCTION. 5 tablet 11  . varenicline (CHANTIX) 1 MG tablet Take 1 mg by mouth 2 (two) times daily.     No current facility-administered medications for this visit.      Physical Exam  Blood pressure (!) 141/78, pulse 71,  height 5\' 10"  (1.778 m), weight 181 lb (82.1 kg).  Constitutional: overall normal hygiene, normal nutrition, well developed, normal grooming, normal body habitus. Assistive device:none  Musculoskeletal: gait and station Limp left, muscle tone and strength are normal, no tremors or atrophy is present.  .  Neurological: coordination overall normal.  Deep tendon reflex/nerve stretch intact.  Sensation normal.  Cranial nerves II-XII intact.   Skin:   Normal overall no scars, lesions, ulcers or rashes. No psoriasis.  Psychiatric: Alert and oriented x 3.  Recent memory intact, remote memory unclear.  Normal mood and affect. Well groomed.  Good eye contact.  Cardiovascular: overall no swelling, no varicosities, no edema bilaterally, normal temperatures of the legs and arms, no clubbing, cyanosis and good capillary refill.  Lymphatic: palpation is normal.  Left knee with slight effusion, ROM 0 to 115, medial joint line pain, positive medial McMurray, [place] intact.  All other systems reviewed and are negative   The patient has been educated about the nature of the problem(s) and counseled on treatment options.  The patient appeared to understand what I have discussed and is in agreement with it.  Encounter Diagnosis  Name Primary?  . Chronic pain of left knee Yes    PLAN Call if any problems.  Precautions discussed.  Continue current medications.   Return  to clinic to see Dr. Romeo Apple   Electronically Signed Darreld Mclean, MD 7/24/20198:57 AM

## 2017-08-28 NOTE — Patient Instructions (Signed)

## 2017-08-29 ENCOUNTER — Other Ambulatory Visit: Payer: Self-pay | Admitting: Physician Assistant

## 2017-08-29 DIAGNOSIS — Z716 Tobacco abuse counseling: Secondary | ICD-10-CM

## 2017-08-29 DIAGNOSIS — F17200 Nicotine dependence, unspecified, uncomplicated: Secondary | ICD-10-CM

## 2017-08-29 MED FILL — CHANTIX 1 MG TABLET: 1 | 30 days supply | Qty: 60 | Fill #0

## 2017-09-16 MED FILL — SILDENAFIL CITRATE 100 MG T: 100 | 30 days supply | Qty: 6 | Fill #3

## 2017-09-16 MED FILL — MOMETASONE FUROATE 50 MCG S: 50 | 30 days supply | Qty: 17 | Fill #2

## 2017-09-20 ENCOUNTER — Encounter: Payer: Self-pay | Admitting: Orthopedic Surgery

## 2017-09-20 ENCOUNTER — Ambulatory Visit: Payer: No Typology Code available for payment source | Admitting: Orthopedic Surgery

## 2017-09-20 VITALS — BP 146/92 | HR 76 | Ht 70.0 in | Wt 180.0 lb

## 2017-09-20 DIAGNOSIS — M23322 Other meniscus derangements, posterior horn of medial meniscus, left knee: Secondary | ICD-10-CM

## 2017-09-20 NOTE — Patient Instructions (Signed)
Meniscus Injury, Arthroscopy Arthroscopy is a surgical procedure that involves the use of a small scope that has a camera and surgical instruments on the end (arthroscope). An arthroscope can be used to repair your meniscus injury.  LET YOUR HEALTH CARE PROVIDER KNOW ABOUT:  Any allergies you have.  All medicines you are taking, including vitamins, herbs, eyedrops, creams, and over-the-counter medicines.  Any recent colds or infections you have had or currently have.  Previous problems you or members of your family have had with the use of anesthetics.  Any blood disorders or blood clotting problems you have.  Previous surgeries you have had.  Medical conditions you have. RISKS AND COMPLICATIONS Generally, this is a safe procedure. However, as with any procedure, problems can occur. Possible problems include:  Damage to nerves or blood vessels.  Excess bleeding.  Blood clots.  Infection. BEFORE THE PROCEDURE  Do not eat or drink for 6-8 hours before the procedure.  Take medicines as directed by your surgeon. Ask your surgeon about changing or stopping your regular medicines.  You may have lab tests the morning of surgery. PROCEDURE  You will be given one of the following:   A medicine that numbs the area (local anesthesia).  A medicine that makes you go to sleep (general anesthesia).  A medicine injected into your spine that numbs your body below the waist (spinal anesthesia). Most often, several small cuts (incisions) are made in the knee. The arthroscope and instruments go into the incisions to repair the damage. The torn portion of the meniscus is removed.   AFTER THE PROCEDURE  You will be taken to the recovery area where your progress will be monitored. When you are awake, stable, and taking fluids without complications, you will be allowed to go home. This is usually the same day. A torn or stretched ligament (ligament sprain) may take 6-8 weeks to heal.   It  takes about the 4-6 WEEKS if your surgeon removed a torn meniscus.  A repaired meniscus may require 6-12 weeks of recovery time.  A torn ligament needing reconstructive surgery may take 6-12 months to heal fully.   This information is not intended to replace advice given to you by your health care provider. Make sure you discuss any questions you have with your health care provider. You have decided to proceed with operative arthroscopy of the knee. You have decided not to continue with nonoperative measures such as but not limited to oral medication, weight loss, activity modification, physical therapy, bracing, or injection.  We will perform operative arthroscopy of the knee. Some of the risks associated with arthroscopic surgery of the knee include but are not limited to Bleeding Infection Swelling Stiffness Blood clot Pain  If you're not comfortable with these risks and would like to continue with nonoperative treatment please let Dr. Harrison know prior to your surgery.   Document Released: 01/20/2000 Document Revised: 01/27/2013 Document Reviewed: 06/20/2012 Elsevier Interactive Patient Education 2016 [employer] You have decided to proceed with operative arthroscopy of the knee. You have decided not to continue with nonoperative measures such as but not limited to oral medication, weight loss, activity modification, physical therapy, bracing, or injection.  In compliance with recent [place] law in federal regulation regarding opioid use and abuse and addiction, we will taper (stop) opioid medication after 2 weeks.  We will perform operative arthroscopy of the knee. Some of the risks associated with arthroscopic surgery of the knee include but are not limited to   Bleeding Infection Swelling Stiffness Blood clot Pain  If you're not comfortable with these risks and would like to continue with nonoperative treatment please let Dr. Harrison know prior to your surgery. 

## 2017-09-20 NOTE — Progress Notes (Signed)
PREOP CONSULT/REFERRAL INTRA-OFFICE FROM DR Gaylene BrooksJW KEELING   Chief Complaint  Patient presents with  . New Patient (Initial Visit)    Consult on L Knee    MEDICAL DECISION SECTION  xrays ordered? no  My independent reading of xrays: First x-ray was 3 views left knee no arthritic changes  Then MRI of the knee showed a torn medial meniscus without any other real problems in the knee   Encounter Diagnosis  Name Primary?  . Derangement of posterior horn of medial meniscus of left knee Yes     PLAN:   Discussion: The patient's symptoms are of locking which is painful but otherwise has a pain-free knee he says the knee actually shifts out of place which is unusual unless he has a bucket-handle type tear.  His patellofemoral joint was stable but will need reevaluation with exam under anesthesia as well as arthroscopically.  I made him aware that his symptoms are unusual   Surgical procedure planned: Arthroscopy left knee partial medial meniscectomy versus possible repair The procedure has been fully reviewed with the patient; The risks and benefits of surgery have been discussed and explained and understood. Alternative treatment has also been reviewed, questions were encouraged and answered. The postoperative plan is also been reviewed.  Nonsurgical treatment as described in the history and physical section was attempted and unsuccessful and the patient has agreed to proceed with surgical intervention to improve their situation.    No orders of the defined types were placed in this encounter.      Chief Complaint  Patient presents with  . New Patient (Initial Visit)    Consult on L Knee    47 year old male [facility] [place] presents for evaluation for surgery on his left knee  He says he hurt his knee throwing a football with his son several years ago.  At that time he is left knee was planted he turned the knee when out of joint he says and then went  back in place after several days of swelling in a few weeks he returned to normal but since that time he says the knee will pop in and out of place depending on the position of his foot and whether he is twisting or turning.  He says it does not hurt but it locks and goes out of place when he twists or turns on it.  Pain only occurs with locking episodes no pain when he is walking normally.     Review of Systems  All other systems reviewed and are negative.    No past medical history on file.  Is healthy with no hypertension or diabetes  No past surgical history on file.  He is never had any surgery  Family History  Problem Relation Age of Onset  . Uterine cancer Mother   . Hypertension Father    Social History   Tobacco Use  . Smoking status: Current Every Day Smoker    Packs/day: 1.50    Years: 27.00    Pack years: 40.50    Types: Cigarettes    Start date: 10/17/1986  . Smokeless tobacco: Never Used  Substance Use Topics  . Alcohol use: Yes    Alcohol/week: 12.0 standard drinks    Types: 12 Cans of beer per week    Comment: 12 cans on weekend  . Drug use: No    No Known Allergies   Current Meds  Medication Sig  . CHANTIX 1 MG tablet TAKE 1  TABLET BY MOUTH 2 TIMES DAILY.  . mometasone (NASONEX) 50 MCG/ACT nasal spray Place 2 sprays into the nose daily.  . sildenafil (VIAGRA) 100 MG tablet TAKE 1/2 TO 1 TABLET (50-100 MG TOTAL) BY MOUTH DAILY AS NEEDED FOR ERECTILE DYSFUNCTION.  . varenicline (CHANTIX) 1 MG tablet Take 1 mg by mouth 2 (two) times daily.    BP (!) 146/92   Pulse 76   Ht 5\' 10"  (1.778 m)   Wt 180 lb (81.6 kg)   BMI 25.83 kg/m   Physical Exam  Constitutional: He is oriented to person, place, and time. He appears well-developed and well-nourished. No distress.  HENT:  Head: Normocephalic and atraumatic.  Right Ear: External ear normal.  Left Ear: External ear normal.  Nose: Nose normal.  Eyes: Pupils are equal, round, and reactive to light.  Conjunctivae and EOM are normal. Right eye exhibits no discharge. Left eye exhibits no discharge.  Neck: Normal range of motion. Neck supple. No tracheal deviation present. No thyromegaly present.  Cardiovascular: Normal rate, regular rhythm and intact distal pulses.  Pulmonary/Chest: Effort normal. No stridor. No respiratory distress. He has no wheezes. He exhibits no tenderness.  Abdominal: Soft. He exhibits no distension and no mass. There is no guarding.  Lymphadenopathy:    He has no cervical adenopathy.  Neurological: He is alert and oriented to person, place, and time. He has normal reflexes. Gait normal.  Skin: Skin is warm and dry. Capillary refill takes less than 2 seconds. He is not diaphoretic.  Psychiatric: He has a normal mood and affect. His behavior is normal. Judgment and thought content normal.    Ortho Exam  Right knee alignment is normal no tenderness or swelling he did have some mild patellofemoral apprehension distress full range of motion of the knee ligaments are stable strength was normal muscle tone was normal skin was intact there is no laceration ulcers or subcutaneous nodules pulses normal temperature normal no edema no swelling or varicosities sensation intact no pathologic reflexes coordination and balance were normal  Left knee seem to have some patellofemoral apprehension no swelling alignment was normal full range of motion patella facet was normal on both sides knee was very stable strength was normal he has significant apprehension with McMurray's test skin was intact there were no rashes ulcerations or nodules pulse and perfusion were normal without edema or peripheral swelling sensation was intact  Fuller CanadaStanley Artina Minella, MD 09/20/2017 12:17 PM

## 2017-09-24 ENCOUNTER — Telehealth: Payer: Self-pay | Admitting: Radiology

## 2017-09-24 NOTE — Telephone Encounter (Signed)
Called [facility] plan spoke to Patrick Ramsey ref number for the call is [phone] to get auth for [id number] possible [phone]   Patrick Pewdvised auth only good for one day 10/03/17 faxed clinicals to [phone]

## 2017-09-26 NOTE — Patient Instructions (Signed)
Patrick MallickDavid L Mcneary  09/26/2017     @[email or web address] @   Your procedure is scheduled on  10/03/2017 .  [facility] at  [facility]  [facility].  Call this number if you have problems the morning of surgery:  [phone]   Remember:  Do not eat or drink after midnight.  You may drink clear liquids until  12 midnight 10/02/2017 .  Clear liquids allowed are:                    Water, Juice (non-citric and without pulp), Carbonated beverages, Clear Tea, Black Coffee only, Plain Jell-O only, Gatorade and Plain Popsicles only    Take these medicines the morning of surgery with A SIP OF WATER None    Do not wear jewelry, make-up or nail polish.  Do not wear lotions, powders, or perfumes, or deodorant.  Do not shave 48 hours prior to surgery.  Men may shave face and neck.  Do not bring valuables to the hospital.  [facility] is not responsible for any belongings or valuables.  Contacts, dentures or bridgework may not be worn into surgery.  Leave your suitcase in the car.  After surgery it may be brought to your room.  For patients admitted to the hospital, discharge time will be determined by your treatment team.  Patients discharged the day of surgery will not be allowed to drive home.   Name and phone number of your driver:  Family  Special instructions:  None  Please read over the following fact sheets that you were given. Anesthesia Post-op Instructions and Care and Recovery After Surgery      Knee Ligament Injury, Arthroscopy Arthroscopy is a surgical technique in which your health care provider examines your knee through a small, pencil-sized telescope (arthroscope). Often, repairs to injured ligaments can be done with instruments in the arthroscope. Arthroscopy is less invasive than open-knee surgery. Tell a health care provider about:  Any allergies you have.  All medicines you are taking, including vitamins, herbs, eye drops, creams, and over-the-counter  medicines.  Any problems you or family members have had with anesthetic medicines.  Any blood disorders you have.  Any surgeries you have had.  Any medical conditions you have. What are the risks? Generally, this is a safe procedure. However, as with any procedure, problems can occur. Possible problems include:  Infection.  Bleeding.  Stiffness.  What happens before the procedure?  Ask your health care provider about changing or stopping any regular medicines. Avoid taking aspirin or blood thinners as directed by your health care provider.  Do not eat or drink anything after midnight the night before surgery.  If you smoke, do not smoke for at least 2 weeks before your surgery.  Do not drink alcohol starting the day before your surgery.  Let your health care provider know if you develop a cold or any infection before your surgery.  Arrange for someone to drive you home after the surgery or after your hospital stay. Also arrange for someone to help you with activities during recovery. What happens during the procedure?  Small monitors will be put on your body. They are used to check your heart, blood pressure, and oxygen levels.  An IV access tube will be put into one of your veins. Medicine will be able to flow directly into your body through this IV tube.  You might be given a medicine to help  you relax (sedative).  You will be given a medicine that makes you go to sleep (general anesthetic), and a breathing tube will be placed into your lungs during the procedure.  Several small incisions are made in your knee. Saline fluid is placed into one of the incisions to expand the knee and clear away any blood in the knee.  Your health care provider will insert the arthroscope to examine the injured knee.  During arthroscopy, your health care provider may find a partial or complete tear in a ligament.  Tools can be inserted through the other incisions to repair the injured  ligaments.  The incisions are then closed with absorbable stitches and covered with dressings. What happens after the procedure?  You will be taken to the recovery area where you will be monitored.  When you are awake, stable, and taking fluids without problems, you will be allowed to go home. This information is not intended to replace advice given to you by your health care provider. Make sure you discuss any questions you have with your health care provider. Document Released: 01/20/2000 Document Revised: 06/30/2015 Document Reviewed: 09/03/2012 Elsevier Interactive Patient Education  2017 [employer]  Arthroscopic Knee Ligament Repair, Care After This sheet gives you information about how to care for yourself after your procedure. Your health care provider may also give you more specific instructions. If you have problems or questions, contact your health care provider. What can I expect after the procedure? After the procedure, it is common to have:  Pain in your knee.  Bruising and swelling on your knee, calf, and ankle for 3-4 days.  Fatigue.  Follow these instructions at home: If you have a brace or immobilizer:  Wear the brace or immobilizer as told by your health care provider. Remove it only as told by your health care provider.  Loosen the splint or immobilizer if your toes tingle, become numb, or turn cold and blue.  Keep the brace or immobilizer clean. Bathing  Do not take baths, swim, or use a hot tub until your health care provider approves. Ask your health care provider if you can take showers.  Keep your bandage (dressing) dry until your health care provider says that it can be removed. Cover it and your brace or immobilizer with a watertight covering when you take a shower. Incision care  Follow instructions from your health care provider about how to take care of your incision. Make sure you: ? Wash your hands with soap and water before you change your  bandage (dressing). If soap and water are not available, use hand sanitizer. ? Change your dressing as told by your health care provider. ? Leave stitches (sutures), skin glue, or adhesive strips in place. These skin closures may need to stay in place for 2 weeks or longer. If adhesive strip edges start to loosen and curl up, you may trim the loose edges. Do not remove adhesive strips completely unless your health care provider tells you to do that.  Check your incision area every day for signs of infection. Check for: ? More redness, swelling, or pain. ? More fluid or blood. ? Warmth. ? Pus or a bad smell. Managing pain, stiffness, and swelling  If directed, put ice on the affected area. ? If you have a removable brace or immobilizer, remove it as told by your health care provider. ? Put ice in a plastic bag. ? Place a towel between your skin and the bag or between your  brace or immobilizer and the bag. ? Leave the ice on for 20 minutes, 2-3 times a day.  Move your toes often to avoid stiffness and to lessen swelling.  Raise (elevate) the injured area above the level of your heart while you are sitting or lying down. Driving  Do not drive until your health care provider approves. If you have a brace or immobilizer on your leg, ask your health care provider when it is safe for you to drive.  Do not drive or use heavy machinery while taking prescription pain medicine. Activity  Rest as directed. Ask your health care provider what activities are safe for you.  Do physical therapy exercises as told by your health care provider. Physical therapy will help you regain strength and motion in your knee.  Follow instructions from your health care provider about: ? When you may start motion exercises. ? When you may start riding a stationary bike and doing other low-impact activities. ? When you may start to jog and do other high-impact activities. Safety  Do not use the injured limb to  support your body weight until your health care provider says that you can. Use crutches as told by your health care provider. General instructions  Do not use any products that contain nicotine or tobacco, such as cigarettes and e-cigarettes. These can delay bone healing. If you need help quitting, ask your health care provider.  To prevent or treat constipation while you are taking prescription pain medicine, your health care provider may recommend that you: ? Drink enough fluid to keep your urine clear or pale yellow. ? Take over-the-counter or prescription medicines. ? Eat foods that are high in fiber, such as fresh fruits and vegetables, whole grains, and beans. ? Limit foods that are high in fat and processed sugars, such as fried and sweet foods.  Take over-the-counter and prescription medicines only as told by your health care provider.  Keep all follow-up visits as told by your health care provider. This is important. Contact a health care provider if:  You have more redness, swelling, or pain around an incision.  You have more fluid or blood coming from an incision.  Your incision feels warm to the touch.  You have a fever.  You have pain or swelling in your knee, and it gets worse.  You have pain that does not get better with medicine. Get help right away if:  You have trouble breathing.  You have pus or a bad smell coming from an incision.  You have numbness and tingling near the knee joint. Summary  After the procedure, it is common to have knee pain with bruising and swelling on your knee, calf, and ankle.  Icing your knee and raising your leg above the level of your heart will help control the pain and the swelling.  Do physical therapy exercises as told by your health care provider. Physical therapy will help you regain strength and motion in your knee. This information is not intended to replace advice given to you by your health care provider. Make sure you  discuss any questions you have with your health care provider. Document Released: 11/12/2012 Document Revised: 01/17/2016 Document Reviewed: 01/17/2016 Elsevier Interactive Patient Education  2017 [employer]  General Anesthesia, Adult General anesthesia is the use of medicines to make a person "go to sleep" (be unconscious) for a medical procedure. General anesthesia is often recommended when a procedure:  Is long.  Requires you to be still or  in an unusual position.  Is major and can cause you to lose blood.  Is impossible to do without general anesthesia.  The medicines used for general anesthesia are called general anesthetics. In addition to making you sleep, the medicines:  Prevent pain.  Control your blood pressure.  Relax your muscles.  Tell a health care provider about:  Any allergies you have.  All medicines you are taking, including vitamins, herbs, eye drops, creams, and over-the-counter medicines.  Any problems you or family members have had with anesthetic medicines.  Types of anesthetics you have had in the past.  Any bleeding disorders you have.  Any surgeries you have had.  Any medical conditions you have.  Any history of heart or lung conditions, such as heart failure, sleep apnea, or chronic obstructive pulmonary disease (COPD).  Whether you are pregnant or may be pregnant.  Whether you use tobacco, alcohol, marijuana, or street drugs.  Any history of [employer].  Any history of depression or anxiety. What are the risks? Generally, this is a safe procedure. However, problems may occur, including:  Allergic reaction to anesthetics.  Lung and heart problems.  Inhaling food or liquids from your stomach into your lungs (aspiration).  Injury to nerves.  Waking up during your procedure and being unable to move (rare).  Extreme agitation or a state of mental confusion (delirium) when you wake up from the anesthetic.  Air in the  bloodstream, which can lead to stroke.  These problems are more likely to develop if you are having a major surgery or if you have an advanced medical condition. You can prevent some of these complications by answering all of your health care provider's questions thoroughly and by following all pre-procedure instructions. General anesthesia can cause side effects, including:  Nausea or vomiting  A sore throat from the breathing tube.  Feeling cold or shivery.  Feeling tired, washed out, or achy.  Sleepiness or drowsiness.  Confusion or agitation.  What happens before the procedure? Staying hydrated Follow instructions from your health care provider about hydration, which may include:  Up to 2 hours before the procedure - you may continue to drink clear liquids, such as water, clear fruit juice, black coffee, and plain tea.  Eating and drinking restrictions Follow instructions from your health care provider about eating and drinking, which may include:  8 hours before the procedure - stop eating heavy meals or foods such as meat, fried foods, or fatty foods.  6 hours before the procedure - stop eating light meals or foods, such as toast or cereal.  6 hours before the procedure - stop drinking milk or drinks that contain milk.  2 hours before the procedure - stop drinking clear liquids.  Medicines  Ask your health care provider about: ? Changing or stopping your regular medicines. This is especially important if you are taking diabetes medicines or blood thinners. ? Taking medicines such as aspirin and ibuprofen. These medicines can thin your blood. Do not take these medicines before your procedure if your health care provider instructs you not to. ? Taking new dietary supplements or medicines. Do not take these during the week before your procedure unless your health care provider approves them.  If you are told to take a medicine or to continue taking a medicine on the day of  the procedure, take the medicine with sips of water. General instructions   Ask if you will be going home the same day, the following day, or  after a longer hospital stay. ? Plan to have someone take you home. ? Plan to have someone stay with you for the first 24 hours after you leave the hospital or clinic.  For 3-6 weeks before the procedure, try not to use any tobacco products, such as cigarettes, chewing tobacco, and e-cigarettes.  You may brush your teeth on the morning of the procedure, but make sure to spit out the toothpaste. What happens during the procedure?  You will be given anesthetics through a mask and through an IV tube in one of your veins.  You may receive medicine to help you relax (sedative).  As soon as you are asleep, a breathing tube may be used to help you breathe.  An anesthesia specialist will stay with you throughout the procedure. He or she will help keep you comfortable and safe by continuing to give you medicines and adjusting the amount of medicine that you get. He or she will also watch your blood pressure, pulse, and oxygen levels to make sure that the anesthetics do not cause any problems.  If a breathing tube was used to help you breathe, it will be removed before you wake up. The procedure may vary among health care providers and hospitals. What happens after the procedure?  You will wake up, often slowly, after the procedure is complete, usually in a recovery area.  Your blood pressure, heart rate, breathing rate, and blood oxygen level will be monitored until the medicines you were given have worn off.  You may be given medicine to help you calm down if you feel anxious or agitated.  If you will be going home the same day, your health care provider may check to make sure you can stand, drink, and urinate.  Your health care providers will treat your pain and side effects before you go home.  Do not drive for 24 hours if you received a  sedative.  You may: ? Feel nauseous and vomit. ? Have a sore throat. ? Have mental slowness. ? Feel cold or shivery. ? Feel sleepy. ? Feel tired. ? Feel sore or achy, even in parts of your body where you did not have surgery. This information is not intended to replace advice given to you by your health care provider. Make sure you discuss any questions you have with your health care provider. Document Released: 05/01/2007 Document Revised: 07/05/2015 Document Reviewed: 01/06/2015 Elsevier Interactive Patient Education  2018 [employer]. General Anesthesia, Adult, Care After These instructions provide you with information about caring for yourself after your procedure. Your health care provider may also give you more specific instructions. Your treatment has been planned according to current medical practices, but problems sometimes occur. Call your health care provider if you have any problems or questions after your procedure. What can I expect after the procedure? After the procedure, it is common to have:  Vomiting.  A sore throat.  Mental slowness.  It is common to feel:  Nauseous.  Cold or shivery.  Sleepy.  Tired.  Sore or achy, even in parts of your body where you did not have surgery.  Follow these instructions at home: For at least 24 hours after the procedure:  Do not: ? Participate in activities where you could fall or become injured. ? Drive. ? Use heavy machinery. ? Drink alcohol. ? Take sleeping pills or medicines that cause drowsiness. ? Make important decisions or sign legal documents. ? Take care of children on your own.  Rest.  Eating and drinking  If you vomit, drink water, juice, or soup when you can drink without vomiting.  Drink enough fluid to keep your urine clear or pale yellow.  Make sure you have little or no nausea before eating solid foods.  Follow the diet recommended by your health care provider. General instructions  Have a  responsible adult stay with you until you are awake and alert.  Return to your normal activities as told by your health care provider. Ask your health care provider what activities are safe for you.  Take over-the-counter and prescription medicines only as told by your health care provider.  If you smoke, do not smoke without supervision.  Keep all follow-up visits as told by your health care provider. This is important. Contact a health care provider if:  You continue to have nausea or vomiting at home, and medicines are not helpful.  You cannot drink fluids or start eating again.  You cannot urinate after 8-12 hours.  You develop a skin rash.  You have fever.  You have increasing redness at the site of your procedure. Get help right away if:  You have difficulty breathing.  You have chest pain.  You have unexpected bleeding.  You feel that you are having a life-threatening or urgent problem. This information is not intended to replace advice given to you by your health care provider. Make sure you discuss any questions you have with your health care provider. Document Released: 04/30/2000 Document Revised: 06/27/2015 Document Reviewed: 01/06/2015 Elsevier Interactive Patient Education  [employer].

## 2017-09-30 ENCOUNTER — Encounter (HOSPITAL_COMMUNITY)
Admission: RE | Admit: 2017-09-30 | Discharge: 2017-09-30 | Disposition: A | Discharge status: Home / Self Care | Payer: No Typology Code available for payment source | Source: Ambulatory Visit | Attending: Orthopedic Surgery | Admitting: Orthopedic Surgery

## 2017-09-30 ENCOUNTER — Encounter (HOSPITAL_COMMUNITY): Payer: Self-pay

## 2017-09-30 ENCOUNTER — Other Ambulatory Visit: Payer: Self-pay

## 2017-09-30 DIAGNOSIS — Z01818 Encounter for other preprocedural examination: Secondary | ICD-10-CM | POA: Insufficient documentation

## 2017-09-30 MED ORDER — MIDAZOLAM HCL 2 MG/2ML IJ SOLN
INTRAMUSCULAR | Status: AC
  Filled 2017-09-30: qty 4

## 2017-10-02 ENCOUNTER — Encounter: Payer: Self-pay | Admitting: Orthopedic Surgery

## 2017-10-02 NOTE — H&P (Signed)
PREOP CONSULT/REFERRAL INTRA-OFFICE FROM DR Gaylene Brooks         Chief Complaint  Patient presents with  . New Patient (Initial Visit)      Consult on L Knee      MEDICAL DECISION SECTION  xrays ordered? no   My independent reading of xrays: First x-ray was 3 views left knee no arthritic changes   Then MRI of the knee showed a torn medial meniscus without any other real problems in the knee         Encounter Diagnosis  Name Primary?  . Derangement of posterior horn of medial meniscus of left knee Yes        PLAN:    Discussion: The patient's symptoms are of locking which is painful but otherwise has a pain-free knee he says the knee actually shifts out of place which is unusual unless he has a bucket-handle type tear.   His patellofemoral joint was stable but will need reevaluation with exam under anesthesia as well as arthroscopically.  I made him aware that his symptoms are unusual     Surgical procedure planned: Arthroscopy left knee partial medial meniscectomy versus possible repair The procedure has been fully reviewed with the patient; The risks and benefits of surgery have been discussed and explained and understood. Alternative treatment has also been reviewed, questions were encouraged and answered. The postoperative plan is also been reviewed.   Nonsurgical treatment as described in the history and physical section was attempted and unsuccessful and the patient has agreed to proceed with surgical intervention to improve their situation.       No orders of the defined types were placed in this encounter.               Chief Complaint  Patient presents with  . 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  At that time he is left knee was planted he turned the  knee when out of joint he says and then went back in place after several days of swelling in a few weeks he returned to normal but since that time he says the knee will pop in and out of place depending on the position of his foot and whether he is twisting or turning.  He says it does not hurt but it locks and goes out of place when he twists or turns on it.   Pain only occurs with locking episodes no pain when he is walking normally.         Review of Systems  All other systems reviewed and are negative.       No past medical history on file.  Is healthy with no hypertension or diabetes   No past surgical history on file.  He is never had any surgery        Family History  Problem Relation Age of Onset  . Uterine cancer Mother    . Hypertension Father      Social History         Tobacco Use  . Smoking status: Current Every Day Smoker      Packs/day: 1.50      Years: 27.00      Pack years: 40.50  Types: Cigarettes      Start date: 10/17/1986  . Smokeless tobacco: Never Used  Substance Use Topics  . Alcohol use: Yes      Alcohol/week: 12.0 standard drinks      Types: 12 Cans of beer per week      Comment: 12 cans on weekend  . Drug use: No      No Known Allergies         Current Meds  Medication Sig  . CHANTIX 1 MG tablet TAKE 1 TABLET BY MOUTH 2 TIMES DAILY.  . mometasone (NASONEX) 50 MCG/ACT nasal spray Place 2 sprays into the nose daily.  . sildenafil (VIAGRA) 100 MG tablet TAKE 1/2 TO 1 TABLET (50-100 MG TOTAL) BY MOUTH DAILY AS NEEDED FOR ERECTILE DYSFUNCTION.  . varenicline (CHANTIX) 1 MG tablet Take 1 mg by mouth 2 (two) times daily.      BP (!) 146/92   Pulse 76   Ht 5\' 10"  (1.778 m)   Wt 180 lb (81.6 kg)   BMI 25.83 kg/m    Physical Exam  Constitutional: He is oriented to person, place, and time. He appears well-developed and well-nourished. No distress.  HENT:  Head: Normocephalic and atraumatic.  Right Ear: External ear normal.  Left Ear:  External ear normal.  Nose: Nose normal.  Eyes: Pupils are equal, round, and reactive to light. Conjunctivae and EOM are normal. Right eye exhibits no discharge. Left eye exhibits no discharge.  Neck: Normal range of motion. Neck supple. No tracheal deviation present. No thyromegaly present.  Cardiovascular: Normal rate, regular rhythm and intact distal pulses.  Pulmonary/Chest: Effort normal. No stridor. No respiratory distress. He has no wheezes. He exhibits no tenderness.  Abdominal: Soft. He exhibits no distension and no mass. There is no guarding.  Lymphadenopathy:    He has no cervical adenopathy.  Neurological: He is alert and oriented to person, place, and time. He has normal reflexes. Gait normal.  Skin: Skin is warm and dry. Capillary refill takes less than 2 seconds. He is not diaphoretic.  Psychiatric: He has a normal mood and affect. His behavior is normal. Judgment and thought content normal.      Ortho Exam   Right knee alignment is normal no tenderness or swelling he did have some mild patellofemoral apprehension distress full range of motion of the knee ligaments are stable strength was normal muscle tone was normal skin was intact there is no laceration ulcers or subcutaneous nodules pulses normal temperature normal no edema no swelling or varicosities sensation intact no pathologic reflexes coordination and balance were normal   Left knee seem to have some patellofemoral apprehension no swelling alignment was normal full range of motion patella facet was normal on both sides knee was very stable strength was normal he has significant apprehension with McMurray's test skin was intact there were no rashes ulcerations or nodules pulse and perfusion were normal without edema or peripheral swelling sensation was intact   Patrick CanadaStanley Harrison, MD

## 2017-10-03 ENCOUNTER — Other Ambulatory Visit: Payer: Self-pay | Admitting: Orthopedic Surgery

## 2017-10-03 ENCOUNTER — Encounter (HOSPITAL_COMMUNITY)
Admission: RE | Disposition: A | Discharge status: Home / Self Care | Payer: Self-pay | Source: Ambulatory Visit | Attending: Orthopedic Surgery

## 2017-10-03 ENCOUNTER — Ambulatory Visit (HOSPITAL_COMMUNITY)
Admission: RE | Admit: 2017-10-03 | Discharge: 2017-10-03 | Disposition: A | Discharge status: Home / Self Care | Payer: No Typology Code available for payment source | Source: Ambulatory Visit | Attending: Orthopedic Surgery | Admitting: Orthopedic Surgery

## 2017-10-03 ENCOUNTER — Ambulatory Visit (HOSPITAL_COMMUNITY): Payer: No Typology Code available for payment source | Admitting: Anesthesiology

## 2017-10-03 DIAGNOSIS — M23222 Derangement of posterior horn of medial meniscus due to old tear or injury, left knee: Secondary | ICD-10-CM | POA: Insufficient documentation

## 2017-10-03 DIAGNOSIS — S83222D Peripheral tear of medial meniscus, current injury, left knee, subsequent encounter: Secondary | ICD-10-CM | POA: Diagnosis not present

## 2017-10-03 DIAGNOSIS — Z8049 Family history of malignant neoplasm of other genital organs: Secondary | ICD-10-CM | POA: Insufficient documentation

## 2017-10-03 DIAGNOSIS — Z8249 Family history of ischemic heart disease and other diseases of the circulatory system: Secondary | ICD-10-CM | POA: Insufficient documentation

## 2017-10-03 DIAGNOSIS — Z79899 Other long term (current) drug therapy: Secondary | ICD-10-CM | POA: Insufficient documentation

## 2017-10-03 DIAGNOSIS — F17210 Nicotine dependence, cigarettes, uncomplicated: Secondary | ICD-10-CM | POA: Insufficient documentation

## 2017-10-03 DIAGNOSIS — S83222A Peripheral tear of medial meniscus, current injury, left knee, initial encounter: Secondary | ICD-10-CM

## 2017-10-03 HISTORY — PX: KNEE ARTHROSCOPY WITH MEDIAL MENISECTOMY: SHX5651

## 2017-10-03 SURGERY — ARTHROSCOPY, KNEE, WITH MEDIAL MENISCECTOMY
Anesthesia: General | Site: Knee | Laterality: Left

## 2017-10-03 MED ORDER — CEFAZOLIN SODIUM-DEXTROSE 2-4 GM/100ML-% IV SOLN: 2.0000 g | INTRAVENOUS | Status: DC

## 2017-10-03 MED ORDER — DEXAMETHASONE SODIUM PHOSPHATE 4 MG/ML IJ SOLN
INTRAMUSCULAR | Status: AC
  Filled 2017-10-03: qty 1

## 2017-10-03 MED ORDER — MIDAZOLAM HCL 2 MG/2ML IJ SOLN
INTRAMUSCULAR | Status: AC
  Filled 2017-10-03: qty 2

## 2017-10-03 MED ORDER — HYDROCODONE-ACETAMINOPHEN 7.5-325 MG PO TABS: 1.0000 | ORAL_TABLET | Freq: Once | ORAL | Status: DC

## 2017-10-03 MED ORDER — LIDOCAINE 2% (20 MG/ML) 5 ML SYRINGE
INTRAMUSCULAR | Status: DC | PRN
  Administered 2017-10-03: 50 mg via INTRAVENOUS

## 2017-10-03 MED ORDER — HYDROCODONE-ACETAMINOPHEN 5-325 MG PO TABS: 1.0000 | ORAL_TABLET | ORAL | 0 refills | Status: DC | PRN

## 2017-10-03 MED ORDER — HYDROCODONE-ACETAMINOPHEN 7.5-325 MG PO TABS: 1.0000 | ORAL_TABLET | Freq: Once | ORAL | Status: DC | PRN

## 2017-10-03 MED ORDER — SUCCINYLCHOLINE CHLORIDE 20 MG/ML IJ SOLN
INTRAMUSCULAR | Status: AC
  Filled 2017-10-03: qty 1

## 2017-10-03 MED ORDER — GLYCOPYRROLATE 0.2 MG/ML IJ SOLN
INTRAMUSCULAR | Status: AC
  Filled 2017-10-03: qty 4

## 2017-10-03 MED ORDER — KETOROLAC TROMETHAMINE 30 MG/ML IJ SOLN
30.0000 mg | Freq: Once | INTRAMUSCULAR | Status: AC
  Administered 2017-10-03: 30 mg via INTRAVENOUS
  Filled 2017-10-03: qty 1

## 2017-10-03 MED ORDER — MIDAZOLAM HCL 5 MG/5ML IJ SOLN
INTRAMUSCULAR | Status: DC | PRN
  Administered 2017-10-03: 2 mg via INTRAVENOUS

## 2017-10-03 MED ORDER — CHLORHEXIDINE GLUCONATE 4 % EX LIQD: 60.0000 mL | Freq: Once | CUTANEOUS | Status: DC

## 2017-10-03 MED ORDER — ONDANSETRON HCL 4 MG/2ML IJ SOLN
INTRAMUSCULAR | Status: AC
  Filled 2017-10-03: qty 2

## 2017-10-03 MED ORDER — SODIUM CHLORIDE 0.9% FLUSH
INTRAVENOUS | Status: AC
  Filled 2017-10-03: qty 30

## 2017-10-03 MED ORDER — SODIUM CHLORIDE 0.9 % IJ SOLN
INTRAMUSCULAR | Status: AC
  Filled 2017-10-03: qty 20

## 2017-10-03 MED ORDER — SODIUM CHLORIDE 0.9 % IR SOLN
Status: DC | PRN
  Administered 2017-10-03: 1000 mL

## 2017-10-03 MED ORDER — FENTANYL CITRATE (PF) 100 MCG/2ML IJ SOLN
INTRAMUSCULAR | Status: DC | PRN
  Administered 2017-10-03 (×3): 50 ug via INTRAVENOUS

## 2017-10-03 MED ORDER — LACTATED RINGERS IV SOLN
INTRAVENOUS | Status: DC
  Administered 2017-10-03 (×2): via INTRAVENOUS

## 2017-10-03 MED ORDER — LIDOCAINE HCL (PF) 1 % IJ SOLN
INTRAMUSCULAR | Status: AC
  Filled 2017-10-03: qty 5

## 2017-10-03 MED ORDER — EPHEDRINE SULFATE 50 MG/ML IJ SOLN
INTRAMUSCULAR | Status: DC | PRN
  Administered 2017-10-03: 10 mg via INTRAVENOUS

## 2017-10-03 MED ORDER — EPHEDRINE SULFATE 50 MG/ML IJ SOLN
INTRAMUSCULAR | Status: AC
  Filled 2017-10-03: qty 2

## 2017-10-03 MED ORDER — BUPIVACAINE-EPINEPHRINE (PF) 0.5% -1:200000 IJ SOLN
INTRAMUSCULAR | Status: DC | PRN
  Administered 2017-10-03: 60 mL via PERINEURAL

## 2017-10-03 MED ORDER — PROPOFOL 10 MG/ML IV BOLUS
INTRAVENOUS | Status: AC
  Filled 2017-10-03: qty 20

## 2017-10-03 MED ORDER — ONDANSETRON HCL 4 MG/2ML IJ SOLN
4.0000 mg | Freq: Once | INTRAMUSCULAR | Status: AC
  Administered 2017-10-03: 4 mg via INTRAVENOUS
  Filled 2017-10-03: qty 2

## 2017-10-03 MED ORDER — GLYCOPYRROLATE PF 0.2 MG/ML IJ SOSY
PREFILLED_SYRINGE | INTRAMUSCULAR | Status: DC | PRN
  Administered 2017-10-03: .2 mg via INTRAVENOUS

## 2017-10-03 MED ORDER — FENTANYL CITRATE (PF) 100 MCG/2ML IJ SOLN: 25.0000 ug | INTRAMUSCULAR | Status: DC | PRN

## 2017-10-03 MED ORDER — CEFAZOLIN SODIUM-DEXTROSE 2-4 GM/100ML-% IV SOLN
2.0000 g | INTRAVENOUS | Status: AC
  Administered 2017-10-03: 2 g via INTRAVENOUS
  Filled 2017-10-03: qty 100

## 2017-10-03 MED ORDER — PROPOFOL 10 MG/ML IV BOLUS
INTRAVENOUS | Status: DC | PRN
  Administered 2017-10-03: 150 mg via INTRAVENOUS

## 2017-10-03 MED ORDER — SODIUM CHLORIDE 0.9 % IR SOLN
Status: DC | PRN
  Administered 2017-10-03 (×4): 3000 mL

## 2017-10-03 MED ORDER — FENTANYL CITRATE (PF) 250 MCG/5ML IJ SOLN
INTRAMUSCULAR | Status: AC
  Filled 2017-10-03: qty 5

## 2017-10-03 MED ORDER — BUPIVACAINE-EPINEPHRINE (PF) 0.5% -1:200000 IJ SOLN
INTRAMUSCULAR | Status: AC
  Filled 2017-10-03: qty 60

## 2017-10-03 SURGICAL SUPPLY — 49 items
ARTHROWAND PARAGON T2 (SURGICAL WAND)
BANDAGE ELASTIC 6 LF NS (GAUZE/BANDAGES/DRESSINGS) ×3 IMPLANT
BLADE AGGRESSIVE PLUS 4.0 (BLADE) ×3 IMPLANT
BLADE SURG SZ11 CARB STEEL (BLADE) ×3 IMPLANT
CHLORAPREP W/TINT 26ML (MISCELLANEOUS) ×3 IMPLANT
CLOTH BEACON ORANGE TIMEOUT ST (SAFETY) ×3 IMPLANT
COOLER CRYO IC GRAV AND TUBE (ORTHOPEDIC SUPPLIES) ×3 IMPLANT
CUFF CRYO KNEE18X23 MED (MISCELLANEOUS) ×3 IMPLANT
CUFF TOURNIQUET SINGLE 34IN LL (TOURNIQUET CUFF) ×3 IMPLANT
CUTTER ANGLED DBL BITE 4.5 (BURR) IMPLANT
DECANTER SPIKE VIAL GLASS SM (MISCELLANEOUS) ×6 IMPLANT
GAUZE 4X4 16PLY RFD (DISPOSABLE) ×3 IMPLANT
GAUZE SPONGE 4X4 12PLY STRL (GAUZE/BANDAGES/DRESSINGS) ×3 IMPLANT
GAUZE XEROFORM 5X9 LF (GAUZE/BANDAGES/DRESSINGS) ×3 IMPLANT
GLOVE BIOGEL PI IND STRL 7.0 (GLOVE) ×1 IMPLANT
GLOVE BIOGEL PI INDICATOR 7.0 (GLOVE) ×2
GLOVE SKINSENSE NS SZ8.0 LF (GLOVE) ×2
GLOVE SKINSENSE STRL SZ8.0 LF (GLOVE) ×1 IMPLANT
GLOVE SS N UNI LF 8.5 STRL (GLOVE) ×3 IMPLANT
GOWN STRL REUS W/ TWL LRG LVL3 (GOWN DISPOSABLE) ×1 IMPLANT
GOWN STRL REUS W/TWL LRG LVL3 (GOWN DISPOSABLE) ×2
GOWN STRL REUS W/TWL XL LVL3 (GOWN DISPOSABLE) ×3 IMPLANT
HLDR LEG FOAM (MISCELLANEOUS) ×1 IMPLANT
IV NS IRRIG 3000ML ARTHROMATIC (IV SOLUTION) ×6 IMPLANT
KIT BLADEGUARD II DBL (SET/KITS/TRAYS/PACK) ×3 IMPLANT
KIT TURNOVER CYSTO (KITS) ×3 IMPLANT
LEG HOLDER FOAM (MISCELLANEOUS) ×2
MANIFOLD NEPTUNE II (INSTRUMENTS) ×3 IMPLANT
MARKER SKIN DUAL TIP RULER LAB (MISCELLANEOUS) ×3 IMPLANT
NEEDLE HYPO 18GX1.5 BLUNT FILL (NEEDLE) ×3 IMPLANT
NEEDLE HYPO 21X1.5 SAFETY (NEEDLE) ×3 IMPLANT
NEEDLE SPNL 18GX3.5 QUINCKE PK (NEEDLE) ×3 IMPLANT
NS IRRIG 1000ML POUR BTL (IV SOLUTION) ×3 IMPLANT
PACK ARTHRO LIMB DRAPE STRL (MISCELLANEOUS) ×3 IMPLANT
PAD ABD 5X9 TENDERSORB (GAUZE/BANDAGES/DRESSINGS) ×3 IMPLANT
PAD ARMBOARD 7.5X6 YLW CONV (MISCELLANEOUS) ×3 IMPLANT
PADDING CAST COTTON 6X4 STRL (CAST SUPPLIES) ×3 IMPLANT
PADDING WEBRIL 6 STERILE (GAUZE/BANDAGES/DRESSINGS) ×3 IMPLANT
PROBE BIPOLAR 50 DEGREE SUCT (MISCELLANEOUS) IMPLANT
PROBE BIPOLAR ATHRO 135MM 90D (MISCELLANEOUS) IMPLANT
SET ARTHROSCOPY INST (INSTRUMENTS) ×3 IMPLANT
SET BASIN LINEN APH (SET/KITS/TRAYS/PACK) ×3 IMPLANT
SUT ETHILON 3 0 FSL (SUTURE) ×3 IMPLANT
SYR 10ML LL (SYRINGE) ×3 IMPLANT
SYR 30ML LL (SYRINGE) ×3 IMPLANT
TUBE CONNECTING 12'X1/4 (SUCTIONS) ×2
TUBE CONNECTING 12X1/4 (SUCTIONS) ×4 IMPLANT
TUBING ARTHRO INFLOW-ONLY STRL (TUBING) ×3 IMPLANT
WAND ARTHRO PARAGON T2 (SURGICAL WAND) IMPLANT

## 2017-10-03 NOTE — Anesthesia Procedure Notes (Signed)
Procedure Name: LMA Insertion Date/Time: 10/03/2017 10:31 AM Performed by: Pernell DupreAdams, Amy A, CRNA Pre-anesthesia Checklist: Patient identified, Emergency Drugs available, Timeout performed, Suction available and Patient being monitored Patient Re-evaluated:Patient Re-evaluated prior to induction Oxygen Delivery Method: Circle system utilized Preoxygenation: Pre-oxygenation with 100% oxygen Induction Type: IV induction Ventilation: Mask ventilation without difficulty LMA: LMA inserted LMA Size: 4.0 Number of attempts: 1 Placement Confirmation: positive ETCO2 and breath sounds checked- equal and bilateral Tube secured with: Tape

## 2017-10-03 NOTE — Discharge Instructions (Signed)
Surgical findings: The meniscus tear that you have was repairable  However, we had an equipment issue and could not repair the meniscus.  So with your approval we will reschedule the surgery for next week to repair the meniscus.   PATIENT INSTRUCTIONS POST-ANESTHESIA  IMMEDIATELY FOLLOWING SURGERY:  Do not drive or operate machinery for the first twenty four hours after surgery.  Do not make any important decisions for twenty four hours after surgery or while taking narcotic pain medications or sedatives.  If you develop intractable nausea and vomiting or a severe headache please notify your doctor immediately.  FOLLOW-UP:  Please make an appointment with your surgeon as instructed. You do not need to follow up with anesthesia unless specifically instructed to do so.  WOUND CARE INSTRUCTIONS (if applicable):  Keep a dry clean dressing on the anesthesia/puncture wound site if there is drainage.  Once the wound has quit draining you may leave it open to air.  Generally you should leave the bandage intact for twenty four hours unless there is drainage.  If the epidural site drains for more than 36-48 hours please call the anesthesia department.  QUESTIONS?:  Please feel free to call your physician or the hospital operator if you have any questions, and they will be happy to assist you.     Knee Arthroscopy, Care After Refer to this sheet in the next few weeks. These instructions provide you with information about caring for yourself after your procedure. Your health care provider may also give you more specific instructions. Your treatment has been planned according to current medical practices, but problems sometimes occur. Call your health care provider if you have any problems or questions after your procedure. What can I expect after the procedure? After the procedure, it is common to have:  Soreness.  Pain.  Follow these instructions at home: Bathing  Do not take baths, swim, or use a  hot tub until your health care provider approves. Incision care  There are many different ways to close and cover an incision, including stitches, skin glue, and adhesive strips. Follow your health care providers instructions about: ? Incision care. ? Bandage (dressing) changes and removal. ? Incision closure removal.  Check your incision area every day for signs of infection. Watch for: ? Redness, swelling, or pain. ? Fluid, blood, or pus. Activity  Avoid strenuous activities for as long as directed by your health care provider.  Return to your normal activities as directed by your health care provider. Ask your health care provider what activities are safe for you.  Perform range-of-motion exercises only as directed by your health care provider.  Do not lift anything that is heavier than 10 lb (4.5 kg).  Do not drive or operate heavy machinery while taking pain medicine.  If you were given crutches, use them as directed by your health care provider. Managing pain, stiffness, and swelling  If directed, apply ice to the injured area: ? Put ice in a plastic bag. ? Place a towel between your skin and the bag. ? Leave the ice on for 20 minutes, 2-3 times per day.  Raise the injured area above the level of your heart while you are sitting or lying down as directed by your health care provider. General instructions  Keep all follow-up visits as directed by your health care provider. This is important.  Take medicines only as directed by your health care provider.  Do not use any tobacco products, including cigarettes, chewing tobacco, or  electronic cigarettes. If you need help quitting, ask your health care provider.  If you were given compression stockings, wear them as directed by your health care provider. These stockings help prevent blood clots and reduce swelling in your legs. Contact a health care provider if:  You have severe pain with any movement of your knee.  You  notice a bad smell coming from the incision or dressing.  You have redness, swelling, or pain at the site of your incision.  You have fluid, blood, or pus coming from your incision. Get help right away if:  You develop a rash.  You have a fever.  You have difficulty breathing or have shortness of breath.  You develop pain in your calves or in the back of your knee.  You develop chest pain.  You develop numbness or tingling in your leg or foot. This information is not intended to replace advice given to you by your health care provider. Make sure you discuss any questions you have with your health care provider. Document Released: 08/11/2004 Document Revised: 06/24/2015 Document Reviewed: 01/18/2014 Elsevier Interactive Patient Education  [employer]2018 [employer]

## 2017-10-03 NOTE — Brief Op Note (Signed)
10/03/2017  11:36 AM  PATIENT:  Patrick Ramsey  47 y.o. male  PRE-OPERATIVE DIAGNOSIS:  torn medial meniscus left knee  POST-OPERATIVE DIAGNOSIS:  torn medial meniscus left knee  PROCEDURE:  Procedure(s): KNEE ARTHROSCOPY DIAGNOSTIC (Left)  Operative findings repairable posterior horn medial meniscus tear Medial lateral compartments patellofemoral compartments normal ACL PCL intact lateral meniscus intact  SURGEON:  Surgeon(s) and Role:    Vickki Hearing* Jasean Ambrosia E, MD - Primary  Surgery was performed as follows  Patient was identified in the preop area the left knee was confirmed as a surgical site chart review was completed patient was taken to the operating room for general anesthesia appropriate antibiotics sterile prep and drape timeout was completed  Diagnostic arthroscopy was performed the medial meniscus was probed and found to be repairable.   The meniscal repair set was incomplete and the surgery was stopped at that point and I will recommend that the surgery be rescheduled when the set is complete   The patient's family will be made aware and I will talk to the patient once he has recovered from general anesthesia   PHYSICIAN ASSISTANT:   ASSISTANTS: none   ANESTHESIA:   general  EBL:  none   BLOOD ADMINISTERED:none  DRAINS: none   LOCAL MEDICATIONS USED:  MARCAINE     SPECIMEN:  No Specimen  DISPOSITION OF SPECIMEN:  N/A  COUNTS:  YES  TOURNIQUET:  * Missing tourniquet times found for documented tourniquets in log: [id number] *  DICTATION: .Dragon Dictation  PLAN OF CARE: Discharge to home after PACU  PATIENT DISPOSITION:  PACU - hemodynamically stable.   Delay start of Pharmacological VTE agent (>24hrs) due to surgical blood loss or risk of bleeding: not applicable

## 2017-10-03 NOTE — Interval H&P Note (Signed)
History and Physical Interval Note:  10/03/2017 9:26 AM  Patrick Ramsey  has presented today for surgery, with the diagnosis of torn medial meniscus left knee  The various methods of treatment have been discussed with the patient and family. After consideration of risks, benefits and other options for treatment, the patient has consented to  Procedure(s) with comments: KNEE ARTHROSCOPY WITH MEDIAL MENISECTOMY possible repair of meniscus (Left) - left knee arthroscopy with medial meniscectomy possible meniscal repair  as a surgical intervention .  The patient's history has been reviewed, patient examined, no change in status, stable for surgery.  I have reviewed the patient's chart and labs.  Questions were answered to the patient's satisfaction.     [name]

## 2017-10-03 NOTE — Transfer of Care (Signed)
Immediate Anesthesia Transfer of Care Note  Patient: Patrick Ramsey  Procedure(s) Performed: KNEE ARTHROSCOPY WITH MEDIAL MENISECTOMY DIAGNOSTIC (Left Knee)  Patient Location: PACU  Anesthesia Type:General  Level of Consciousness: awake, oriented and patient cooperative  Airway & Oxygen Therapy: Patient Spontanous Breathing  Post-op Assessment: Report given to RN and Post -op Vital signs reviewed and stable  Post vital signs: Reviewed and stable  Last Vitals:  Vitals Value Taken Time  BP 124/93 10/03/2017 11:44 AM  Temp    Pulse 89 10/03/2017 11:45 AM  Resp 13 10/03/2017 11:45 AM  SpO2 96 % 10/03/2017 11:45 AM  Vitals shown include unvalidated device data.  Last Pain:  Vitals:   10/03/17 0920  TempSrc: Oral  PainSc: 0-No pain      Patients Stated Pain Goal: 4 (10/03/17 0920)  Complications: No apparent anesthesia complications

## 2017-10-03 NOTE — Anesthesia Preprocedure Evaluation (Signed)
Anesthesia Evaluation  Patient identified by MRN, date of birth, ID band Patient awake    Reviewed: Allergy & Precautions, NPO status , Patient's Chart, lab work & pertinent test results  Airway Mallampati: II  TM Distance: >3 FB Neck ROM: Full    Dental no notable dental hx. (+) Lower Dentures, Edentulous Upper   Pulmonary neg pulmonary ROS, Current Smoker,  Current 1ppd smoker  Smoked today    Pulmonary exam normal breath sounds clear to auscultation       Cardiovascular Exercise Tolerance: Good negative cardio ROS Normal cardiovascular examI Rhythm:Regular Rate:Normal     Neuro/Psych negative neurological ROS  negative psych ROS   GI/Hepatic negative GI ROS, Neg liver ROS,   Endo/Other  negative endocrine ROS  Renal/GU negative Renal ROS  negative genitourinary   Musculoskeletal negative musculoskeletal ROS (+)   Abdominal   Peds negative pediatric ROS (+)  Hematology negative hematology ROS (+)   Anesthesia Other Findings   Reproductive/Obstetrics negative OB ROS                             Anesthesia Physical Anesthesia Plan  ASA: II  Anesthesia Plan: General   Post-op Pain Management:    Induction: Intravenous  PONV Risk Score and Plan:   Airway Management Planned: LMA  Additional Equipment:   Intra-op Plan:   Post-operative Plan:   Informed Consent: I have reviewed the patients History and Physical, chart, labs and discussed the procedure including the risks, benefits and alternatives for the proposed anesthesia with the patient or authorized representative who has indicated his/her understanding and acceptance.   Dental advisory given  Plan Discussed with: CRNA  Anesthesia Plan Comments: (LMA vs ETT as needed)        Anesthesia Quick Evaluation

## 2017-10-03 NOTE — Anesthesia Postprocedure Evaluation (Signed)
Anesthesia Post Note Late Entry for 1215  Patient: Patrick MallickDavid L Kinsel  Procedure(s) Performed: KNEE ARTHROSCOPY WITH MEDIAL MENISECTOMY DIAGNOSTIC (Left Knee)  Patient location during evaluation: PACU Anesthesia Type: General Level of consciousness: awake and alert and oriented Pain management: pain level controlled Vital Signs Assessment: post-procedure vital signs reviewed and stable Respiratory status: spontaneous breathing Cardiovascular status: stable Postop Assessment: no apparent nausea or vomiting Anesthetic complications: no     Last Vitals:  Vitals:   10/03/17 1215 10/03/17 1231  BP: (!) 124/91 131/86  Pulse: 86 86  Resp: 12   Temp:  36.6 C  SpO2: 96% 95%    Last Pain:  Vitals:   10/03/17 1231  TempSrc: Oral  PainSc: 0-No pain                 [name] A

## 2017-10-03 NOTE — Op Note (Signed)
10/03/2017  11:36 AM  PATIENT:  Patrick Ramsey  47 y.o. male  PRE-OPERATIVE DIAGNOSIS:  torn medial meniscus left knee  POST-OPERATIVE DIAGNOSIS:  torn medial meniscus left knee  PROCEDURE:  Procedure(s): KNEE ARTHROSCOPY DIAGNOSTIC (Left)  29870  Operative findings repairable posterior horn medial meniscus tear Medial lateral compartments patellofemoral compartments normal ACL PCL intact lateral meniscus intact  SURGEON:  Surgeon(s) and Role:    Vickki Hearing* Harrison, Stanley E, MD - Primary  Surgery was performed as follows  Patient was identified in the preop area the left knee was confirmed as a surgical site chart review was completed patient was taken to the operating room for general anesthesia appropriate antibiotics sterile prep and drape timeout was completed  Diagnostic arthroscopy was performed the medial meniscus was probed and found to be repairable.   The meniscal repair set was incomplete and the surgery was stopped at that point and I will recommend that the surgery be rescheduled when the set is complete   The patient's family will be made aware and I will talk to the patient once he has recovered from general anesthesia   PHYSICIAN ASSISTANT:   ASSISTANTS: none   ANESTHESIA:   general  EBL:  none   BLOOD ADMINISTERED:none  DRAINS: none   LOCAL MEDICATIONS USED:  MARCAINE     SPECIMEN:  No Specimen  DISPOSITION OF SPECIMEN:  N/A  COUNTS:  YES  TOURNIQUET:  * Missing tourniquet times found for documented tourniquets in log: [id number] *  DICTATION: .Dragon Dictation  PLAN OF CARE: Discharge to home after PACU  PATIENT DISPOSITION:  PACU - hemodynamically stable.   Delay start of Pharmacological VTE agent (>24hrs) due to surgical blood loss or risk of bleeding: not applicable

## 2017-10-04 ENCOUNTER — Telehealth: Payer: Self-pay | Admitting: Orthopedic Surgery

## 2017-10-04 ENCOUNTER — Encounter (HOSPITAL_COMMUNITY): Payer: Self-pay | Admitting: Orthopedic Surgery

## 2017-10-04 DIAGNOSIS — M23322 Other meniscus derangements, posterior horn of medial meniscus, left knee: Secondary | ICD-10-CM

## 2017-10-04 NOTE — Telephone Encounter (Signed)
Call from [facility] [place], ph#[phone], needing to verify date of surgery - 10/03/17 Vs 10/10/17 - Left knee arthroscopy with medial meniscectomy. Per Dr Romeo AppleHarrison, surgery is re-scheduled for 10/10/17. Please advise.

## 2017-10-08 ENCOUNTER — Encounter (HOSPITAL_COMMUNITY)
Admit: 2017-10-08 | Discharge: 2017-10-08 | Disposition: A | Discharge status: Home / Self Care | Payer: No Typology Code available for payment source | Attending: Orthopedic Surgery | Admitting: Orthopedic Surgery

## 2017-10-08 NOTE — Telephone Encounter (Signed)
This has been approved for date 10/10/17 case number [id number]

## 2017-10-08 NOTE — H&P (Signed)
Chief Complaint  Patient presents with  . 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  At that time he is left knee was planted he turned the knee when out of joint he says and then went back in place after several days of swelling in a few weeks he returned to normal but since that time he says the knee will pop in and out of place depending on the position of his foot and whether he is twisting or turning.  He says it does not hurt but it locks and goes out of place when he twists or turns on it.   Pain only occurs with locking episodes no pain when he is walking normally.         Review of Systems  All other systems reviewed and are negative.       No past medical history on file.  Is healthy with no hypertension or diabetes   No past surgical history on file.  He is never had any surgery            Family History  Problem Relation Age of Onset  . Uterine cancer Mother    . Hypertension Father      Social History             Tobacco Use  . Smoking status: Current Every Day Smoker      Packs/day: 1.50      Years: 27.00      Pack years: 40.50      Types: Cigarettes      Start date: 10/17/1986  . Smokeless tobacco: Never Used  Substance Use Topics  . Alcohol use: Yes      Alcohol/week: 12.0 standard drinks      Types: 12 Cans of beer per week      Comment: 12 cans on weekend  . Drug use: No      No Known Allergies            Current Meds  Medication Sig  . CHANTIX 1 MG tablet TAKE 1 TABLET BY MOUTH 2 TIMES DAILY.  . mometasone (NASONEX) 50 MCG/ACT nasal spray Place 2 sprays into the nose daily.  . sildenafil (VIAGRA) 100 MG tablet TAKE 1/2 TO 1 TABLET (50-100 MG TOTAL) BY MOUTH DAILY AS NEEDED FOR ERECTILE DYSFUNCTION.  . varenicline (CHANTIX) 1 MG  tablet Take 1 mg by mouth 2 (two) times daily.      BP (!) 146/92   Pulse 76   Ht 5\' 10"  (1.778 m)   Wt 180 lb (81.6 kg)   BMI 25.83 kg/m    Physical Exam  Constitutional: He is oriented to person, place, and time. He appears well-developed and well-nourished. No distress.  HENT:  Head: Normocephalic and atraumatic.  Right Ear: External ear normal.  Left Ear: External ear normal.  Nose: Nose normal.  Eyes: Pupils are equal, round, and reactive to light. Conjunctivae and EOM are normal. Right eye exhibits no discharge. Left eye exhibits no discharge.  Neck: Normal range of motion. Neck supple. No tracheal deviation present. No thyromegaly present.  Cardiovascular: Normal rate, regular rhythm and intact distal pulses.  Pulmonary/Chest: Effort normal. No stridor. No respiratory  distress. He has no wheezes. He exhibits no tenderness.  Abdominal: Soft. He exhibits no distension and no mass. There is no guarding.  Lymphadenopathy:    He has no cervical adenopathy.  Neurological: He is alert and oriented to person, place, and time. He has normal reflexes. Gait normal.  Skin: Skin is warm and dry. Capillary refill takes less than 2 seconds. He is not diaphoretic.  Psychiatric: He has a normal mood and affect. His behavior is normal. Judgment and thought content normal.      Ortho Exam   Right knee alignment is normal no tenderness or swelling he did have some mild patellofemoral apprehension distress full range of motion of the knee ligaments are stable strength was normal muscle tone was normal skin was intact there is no laceration ulcers or subcutaneous nodules pulses normal temperature normal no edema no swelling or varicosities sensation intact no pathologic reflexes coordination and balance were normal   Left knee seem to have some patellofemoral apprehension no swelling alignment was normal full range of motion patella facet was normal on both sides knee was very stable strength was  normal he has significant apprehension with McMurray's test skin was intact there were no rashes ulcerations or nodules pulse and perfusion were normal without edema or peripheral swelling sensation was intact  Assessment and plan  Mr. Citro presents back for meniscal repair of his right knee left knee after last week's diagnostic arthroscopy confirm meniscal repair was possible for posterior horn medial meniscus but equipment was not available.  Medial meniscal tear left knee  Arthroscopy arthroscopic repair medial meniscus left knee

## 2017-10-10 ENCOUNTER — Ambulatory Visit (HOSPITAL_COMMUNITY)
Admission: RE | Admit: 2017-10-10 | Discharge: 2017-10-10 | Disposition: A | Discharge status: Home / Self Care | Payer: No Typology Code available for payment source | Source: Ambulatory Visit | Attending: Orthopedic Surgery | Admitting: Orthopedic Surgery

## 2017-10-10 ENCOUNTER — Encounter (HOSPITAL_COMMUNITY)
Admission: RE | Disposition: A | Discharge status: Home / Self Care | Payer: Self-pay | Source: Ambulatory Visit | Attending: Orthopedic Surgery

## 2017-10-10 ENCOUNTER — Ambulatory Visit (HOSPITAL_COMMUNITY): Payer: No Typology Code available for payment source | Admitting: Anesthesiology

## 2017-10-10 ENCOUNTER — Encounter (HOSPITAL_COMMUNITY): Payer: Self-pay | Admitting: *Deleted

## 2017-10-10 DIAGNOSIS — F17210 Nicotine dependence, cigarettes, uncomplicated: Secondary | ICD-10-CM | POA: Insufficient documentation

## 2017-10-10 DIAGNOSIS — M23232 Derangement of other medial meniscus due to old tear or injury, left knee: Secondary | ICD-10-CM | POA: Insufficient documentation

## 2017-10-10 DIAGNOSIS — Z79899 Other long term (current) drug therapy: Secondary | ICD-10-CM | POA: Insufficient documentation

## 2017-10-10 DIAGNOSIS — S83242D Other tear of medial meniscus, current injury, left knee, subsequent encounter: Secondary | ICD-10-CM

## 2017-10-10 DIAGNOSIS — Z9889 Other specified postprocedural states: Secondary | ICD-10-CM

## 2017-10-10 HISTORY — PX: KNEE ARTHROSCOPY WITH MENISCAL REPAIR: SHX5653

## 2017-10-10 SURGERY — ARTHROSCOPY, KNEE, WITH MENISCUS REPAIR
Anesthesia: General | Site: Knee | Laterality: Left

## 2017-10-10 MED ORDER — LACTATED RINGERS IV SOLN: INTRAVENOUS | Status: DC

## 2017-10-10 MED ORDER — FENTANYL CITRATE (PF) 250 MCG/5ML IJ SOLN
INTRAMUSCULAR | Status: AC
  Filled 2017-10-10: qty 5

## 2017-10-10 MED ORDER — MEPERIDINE HCL 50 MG/ML IJ SOLN: 6.2500 mg | INTRAMUSCULAR | Status: DC | PRN

## 2017-10-10 MED ORDER — HYDROMORPHONE HCL 1 MG/ML IJ SOLN: 0.2500 mg | INTRAMUSCULAR | Status: DC | PRN

## 2017-10-10 MED ORDER — SODIUM CHLORIDE 0.9 % IR SOLN
Status: DC | PRN
  Administered 2017-10-10: 1000 mL

## 2017-10-10 MED ORDER — ONDANSETRON HCL 4 MG/2ML IJ SOLN
INTRAMUSCULAR | Status: AC
  Filled 2017-10-10: qty 2

## 2017-10-10 MED ORDER — MIDAZOLAM HCL 5 MG/5ML IJ SOLN
INTRAMUSCULAR | Status: DC | PRN
  Administered 2017-10-10: 2 mg via INTRAVENOUS

## 2017-10-10 MED ORDER — CEFAZOLIN SODIUM-DEXTROSE 2-4 GM/100ML-% IV SOLN
2.0000 g | Freq: Once | INTRAVENOUS | Status: AC
  Administered 2017-10-10: 2 g via INTRAVENOUS
  Filled 2017-10-10: qty 100

## 2017-10-10 MED ORDER — BUPIVACAINE-EPINEPHRINE (PF) 0.5% -1:200000 IJ SOLN
INTRAMUSCULAR | Status: AC
  Filled 2017-10-10: qty 60

## 2017-10-10 MED ORDER — HYDROCODONE-ACETAMINOPHEN 7.5-325 MG PO TABS: 1.0000 | ORAL_TABLET | Freq: Once | ORAL | Status: DC

## 2017-10-10 MED ORDER — HYDROCODONE-ACETAMINOPHEN 7.5-325 MG PO TABS: 1.0000 | ORAL_TABLET | ORAL | 0 refills | Status: DC | PRN

## 2017-10-10 MED ORDER — GLYCOPYRROLATE PF 0.2 MG/ML IJ SOSY
PREFILLED_SYRINGE | INTRAMUSCULAR | Status: DC | PRN
  Administered 2017-10-10: .2 mg via INTRAVENOUS

## 2017-10-10 MED ORDER — PROPOFOL 10 MG/ML IV BOLUS
INTRAVENOUS | Status: AC
  Filled 2017-10-10: qty 20

## 2017-10-10 MED ORDER — ONDANSETRON HCL 4 MG/2ML IJ SOLN
INTRAMUSCULAR | Status: DC | PRN
  Administered 2017-10-10: 4 mg via INTRAVENOUS

## 2017-10-10 MED ORDER — PROMETHAZINE HCL 25 MG/ML IJ SOLN: 6.2500 mg | INTRAMUSCULAR | Status: DC | PRN

## 2017-10-10 MED ORDER — BUPIVACAINE-EPINEPHRINE (PF) 0.5% -1:200000 IJ SOLN
INTRAMUSCULAR | Status: DC | PRN
  Administered 2017-10-10: 60 mL via PERINEURAL

## 2017-10-10 MED ORDER — SODIUM CHLORIDE 0.9 % IR SOLN
Status: DC | PRN
  Administered 2017-10-10 (×5): 3000 mL

## 2017-10-10 MED ORDER — HYDROCODONE-ACETAMINOPHEN 7.5-325 MG PO TABS: 1.0000 | ORAL_TABLET | Freq: Once | ORAL | Status: DC | PRN

## 2017-10-10 MED ORDER — LACTATED RINGERS IV SOLN
INTRAVENOUS | Status: DC
  Administered 2017-10-10: 1000 mL via INTRAVENOUS
  Administered 2017-10-10: 09:00:00 via INTRAVENOUS

## 2017-10-10 MED ORDER — PROPOFOL 10 MG/ML IV BOLUS
INTRAVENOUS | Status: DC | PRN
  Administered 2017-10-10: 25 mg via INTRAVENOUS
  Administered 2017-10-10: 150 mg via INTRAVENOUS
  Administered 2017-10-10: 25 mg via INTRAVENOUS

## 2017-10-10 MED ORDER — MIDAZOLAM HCL 2 MG/2ML IJ SOLN
INTRAMUSCULAR | Status: AC
  Filled 2017-10-10: qty 2

## 2017-10-10 MED ORDER — DEXAMETHASONE SODIUM PHOSPHATE 4 MG/ML IJ SOLN
INTRAMUSCULAR | Status: AC
  Filled 2017-10-10: qty 1

## 2017-10-10 MED ORDER — DEXAMETHASONE SODIUM PHOSPHATE 4 MG/ML IJ SOLN
INTRAMUSCULAR | Status: DC | PRN
  Administered 2017-10-10: 4 mg via INTRAVENOUS

## 2017-10-10 MED ORDER — LIDOCAINE HCL (CARDIAC) PF 100 MG/5ML IV SOSY
PREFILLED_SYRINGE | INTRAVENOUS | Status: DC | PRN
  Administered 2017-10-10: 50 mg via INTRAVENOUS

## 2017-10-10 MED ORDER — LIDOCAINE HCL (PF) 1 % IJ SOLN
INTRAMUSCULAR | Status: AC
  Filled 2017-10-10: qty 5

## 2017-10-10 MED ORDER — FENTANYL CITRATE (PF) 100 MCG/2ML IJ SOLN
INTRAMUSCULAR | Status: DC | PRN
  Administered 2017-10-10 (×4): 25 ug via INTRAVENOUS
  Administered 2017-10-10 (×2): 50 ug via INTRAVENOUS

## 2017-10-10 MED ORDER — ONDANSETRON HCL 4 MG/2ML IJ SOLN: 4.0000 mg | Freq: Once | INTRAMUSCULAR | Status: DC

## 2017-10-10 MED ORDER — EPHEDRINE SULFATE 50 MG/ML IJ SOLN
INTRAMUSCULAR | Status: AC
  Filled 2017-10-10: qty 1

## 2017-10-10 MED ORDER — KETOROLAC TROMETHAMINE 30 MG/ML IJ SOLN
INTRAMUSCULAR | Status: AC
  Filled 2017-10-10: qty 1

## 2017-10-10 MED ORDER — KETOROLAC TROMETHAMINE 30 MG/ML IJ SOLN
30.0000 mg | Freq: Once | INTRAMUSCULAR | Status: AC
  Administered 2017-10-10: 30 mg via INTRAVENOUS

## 2017-10-10 MED ORDER — GLYCOPYRROLATE 0.2 MG/ML IJ SOLN
INTRAMUSCULAR | Status: AC
  Filled 2017-10-10: qty 1

## 2017-10-10 SURGICAL SUPPLY — 53 items
ARTHROWAND PARAGON T2 (SURGICAL WAND)
BANDAGE ELASTIC 6 LF NS (GAUZE/BANDAGES/DRESSINGS) ×3 IMPLANT
BANDAGE ESMARK 6X9 LF (GAUZE/BANDAGES/DRESSINGS) ×1 IMPLANT
BLADE 11 SAFETY STRL DISP (BLADE) ×3 IMPLANT
BLADE AGGRESSIVE PLUS 4.0 (BLADE) ×3 IMPLANT
BNDG ESMARK 6X9 LF (GAUZE/BANDAGES/DRESSINGS) ×3
CHLORAPREP W/TINT 26ML (MISCELLANEOUS) ×6 IMPLANT
CLOSURE WOUND 1/2 X4 (GAUZE/BANDAGES/DRESSINGS) ×1
CLOTH BEACON ORANGE TIMEOUT ST (SAFETY) ×3 IMPLANT
COOLER CRYO IC GRAV AND TUBE (ORTHOPEDIC SUPPLIES) ×3 IMPLANT
CUFF CRYO KNEE18X23 MED (MISCELLANEOUS) ×3 IMPLANT
CUFF TOURNIQUET SINGLE 34IN LL (TOURNIQUET CUFF) ×3 IMPLANT
CUTTER TENSIONER SUT 2-0 0 FBW (INSTRUMENTS) ×3 IMPLANT
DECANTER SPIKE VIAL GLASS SM (MISCELLANEOUS) ×6 IMPLANT
GAUZE SPONGE 4X4 12PLY STRL (GAUZE/BANDAGES/DRESSINGS) ×3 IMPLANT
GAUZE SPONGE 4X4 16PLY XRAY LF (GAUZE/BANDAGES/DRESSINGS) ×3 IMPLANT
GAUZE XEROFORM 5X9 LF (GAUZE/BANDAGES/DRESSINGS) ×3 IMPLANT
GLOVE BIO SURGEON STRL SZ7 (GLOVE) ×6 IMPLANT
GLOVE BIOGEL PI IND STRL 7.0 (GLOVE) ×3 IMPLANT
GLOVE BIOGEL PI INDICATOR 7.0 (GLOVE) ×6
GLOVE SKINSENSE NS SZ8.0 LF (GLOVE) ×2
GLOVE SKINSENSE STRL SZ8.0 LF (GLOVE) ×1 IMPLANT
GLOVE SS N UNI LF 8.5 STRL (GLOVE) ×3 IMPLANT
GOWN STRL REUS W/TWL LRG LVL3 (GOWN DISPOSABLE) ×6 IMPLANT
GOWN STRL REUS W/TWL XL LVL3 (GOWN DISPOSABLE) ×3 IMPLANT
HLDR LEG FOAM (MISCELLANEOUS) ×1 IMPLANT
IV NS IRRIG 3000ML ARTHROMATIC (IV SOLUTION) ×15 IMPLANT
KIT BLADEGUARD II DBL (SET/KITS/TRAYS/PACK) ×3 IMPLANT
KIT TURNOVER CYSTO (KITS) ×3 IMPLANT
LEG HOLDER FOAM (MISCELLANEOUS) ×2
MANIFOLD NEPTUNE II (INSTRUMENTS) ×3 IMPLANT
MARKER SKIN DUAL TIP RULER LAB (MISCELLANEOUS) ×3 IMPLANT
MENISCAL CINCH Lawson127359 ×3 IMPLANT
NEEDLE HYPO 18GX1.5 BLUNT FILL (NEEDLE) ×3 IMPLANT
NEEDLE HYPO 21X1.5 SAFETY (NEEDLE) ×3 IMPLANT
NEEDLE SPNL 18GX3.5 QUINCKE PK (NEEDLE) ×3 IMPLANT
NS IRRIG 1000ML POUR BTL (IV SOLUTION) ×3 IMPLANT
PACK ARTHRO LIMB DRAPE STRL (MISCELLANEOUS) ×3 IMPLANT
PAD ABD 5X9 TENDERSORB (GAUZE/BANDAGES/DRESSINGS) ×3 IMPLANT
PAD ARMBOARD 7.5X6 YLW CONV (MISCELLANEOUS) ×3 IMPLANT
PADDING CAST COTTON 6X4 STRL (CAST SUPPLIES) ×3 IMPLANT
PADDING WEBRIL 6 STERILE (GAUZE/BANDAGES/DRESSINGS) ×3 IMPLANT
PROBE BIPOLAR 50 DEGREE SUCT (MISCELLANEOUS) ×3 IMPLANT
Pussher Knot SM 129371 ×3 IMPLANT
SET ARTHROSCOPY INST (INSTRUMENTS) ×3 IMPLANT
SET BASIN LINEN APH (SET/KITS/TRAYS/PACK) ×3 IMPLANT
STRIP CLOSURE SKIN 1/2X4 (GAUZE/BANDAGES/DRESSINGS) ×2 IMPLANT
SUT ETHILON 3 0 FSL (SUTURE) ×3 IMPLANT
SYR 10ML LL (SYRINGE) ×3 IMPLANT
SYR 30ML LL (SYRINGE) ×3 IMPLANT
WAND ARTHRO PARAGON T2 (SURGICAL WAND) IMPLANT
YANKAUER SUCT BULB TIP 10FT TU (MISCELLANEOUS) ×6 IMPLANT
meniscal cinch  Lawson 127359 ×6 IMPLANT

## 2017-10-10 NOTE — Progress Notes (Signed)
Awake. Norco ordered to be given in PACU. Pt refuses Norco. States "I don't need it. I don't like pain med."

## 2017-10-10 NOTE — Interval H&P Note (Signed)
History and Physical Interval Note:  10/10/2017 7:29 AM  Patrick Ramsey  has presented today for surgery, with the diagnosis of medial meniscus tear left knee  The various methods of treatment have been discussed with the patient and family. After consideration of risks, benefits and other options for treatment, the patient has consented to  Procedure(s): REPAIR OF MENISCUS (Left) as a surgical intervention .  The patient's history has been reviewed, patient examined, no change in status, stable for surgery.  I have reviewed the patient's chart and labs.  Questions were answered to the patient's satisfaction.     Fuller Canada

## 2017-10-10 NOTE — Discharge Instructions (Signed)
Your original appointment for Friday, September 6 has been changed to Friday, September 13  The meniscus was successfully repaired  PATIENT INSTRUCTIONS POST-ANESTHESIA  IMMEDIATELY FOLLOWING SURGERY:  Do not drive or operate machinery for the first twenty four hours after surgery.  Do not make any important decisions for twenty four hours after surgery or while taking narcotic pain medications or sedatives.  If you develop intractable nausea and vomiting or a severe headache please notify your doctor immediately.  FOLLOW-UP:  Please make an appointment with your surgeon as instructed. You do not need to follow up with anesthesia unless specifically instructed to do so.  WOUND CARE INSTRUCTIONS (if applicable):  Keep a dry clean dressing on the anesthesia/puncture wound site if there is drainage.  Once the wound has quit draining you may leave it open to air.  Generally you should leave the bandage intact for twenty four hours unless there is drainage.  If the epidural site drains for more than 36-48 hours please call the anesthesia department.  QUESTIONS?:  Please feel free to call your physician or the hospital operator if you have any questions, and they will be happy to assist you.      Knee Arthroscopy, Care After Refer to this sheet in the next few weeks. These instructions provide you with information about caring for yourself after your procedure. Your health care provider may also give you more specific instructions. Your treatment has been planned according to current medical practices, but problems sometimes occur. Call your health care provider if you have any problems or questions after your procedure. What can I expect after the procedure? After the procedure, it is common to have:  Soreness.  Pain.  Follow these instructions at home: Bathing  Do not take baths, swim, or use a hot tub until your health care provider approves. Incision care  There are many different ways  to close and cover an incision, including stitches, skin glue, and adhesive strips. Follow your health care providers instructions about: ? Incision care. ? Bandage (dressing) changes and removal. ? Incision closure removal.  Check your incision area every day for signs of infection. Watch for: ? Redness, swelling, or pain. ? Fluid, blood, or pus. Activity  Avoid strenuous activities for as long as directed by your health care provider.  Return to your normal activities as directed by your health care provider. Ask your health care provider what activities are safe for you.  Perform range-of-motion exercises only as directed by your health care provider.  Do not lift anything that is heavier than 10 lb (4.5 kg).  Do not drive or operate heavy machinery while taking pain medicine.  If you were given crutches, use them as directed by your health care provider. Managing pain, stiffness, and swelling  If directed, apply ice to the injured area: ? Put ice in a plastic bag. ? Place a towel between your skin and the bag. ? Leave the ice on for 20 minutes, 2-3 times per day.  Raise the injured area above the level of your heart while you are sitting or lying down as directed by your health care provider. General instructions  Keep all follow-up visits as directed by your health care provider. This is important.  Take medicines only as directed by your health care provider.  Do not use any tobacco products, including cigarettes, chewing tobacco, or electronic cigarettes. If you need help quitting, ask your health care provider.  If you were given compression stockings, wear  them as directed by your health care provider. These stockings help prevent blood clots and reduce swelling in your legs. Contact a health care provider if:  You have severe pain with any movement of your knee.  You notice a bad smell coming from the incision or dressing.  You have redness, swelling, or pain at  the site of your incision.  You have fluid, blood, or pus coming from your incision. Get help right away if:  You develop a rash.  You have a fever.  You have difficulty breathing or have shortness of breath.  You develop pain in your calves or in the back of your knee.  You develop chest pain.  You develop numbness or tingling in your leg or foot. This information is not intended to replace advice given to you by your health care provider. Make sure you discuss any questions you have with your health care provider. Document Released: 08/11/2004 Document Revised: 06/24/2015 Document Reviewed: 01/18/2014 Elsevier Interactive Patient Education  2018 Elsevier Inc.   Incision Care, Adult An incision is a cut that a doctor makes in your skin for surgery (for a procedure). Most times, these cuts are closed after surgery. Your cut from surgery may be closed with stitches (sutures), staples, skin glue, or skin tape (adhesive strips). You may need to return to your doctor to have stitches or staples taken out. This may happen many days or many weeks after your surgery. The cut needs to be well cared for so it does not get infected. How to care for your cut Cut care  Follow instructions from your doctor about how to take care of your cut. Make sure you: ? Wash your hands with soap and water before you change your bandage (dressing). If you cannot use soap and water, use hand sanitizer. ? Change your bandage as told by your doctor. ? Leave stitches, skin glue, or skin tape in place. They may need to stay in place for 2 weeks or longer. If tape strips get loose and curl up, you may trim the loose edges. Do not remove tape strips completely unless your doctor says it is okay.  Check your cut area every day for signs of infection. Check for: ? More redness, swelling, or pain. ? More fluid or blood. ? Warmth. ? Pus or a bad smell.  Ask your doctor how to clean the cut. This may  include: ? Using mild soap and water. ? Using a clean towel to pat the cut dry after you clean it. ? Putting a cream or ointment on the cut. Do this only as told by your doctor. ? Covering the cut with a clean bandage.  Ask your doctor when you can leave the cut uncovered.  Do not take baths, swim, or use a hot tub until your doctor says it is okay. Ask your doctor if you can take showers. You may only be allowed to take sponge baths for bathing. Medicines  If you were prescribed an antibiotic medicine, cream, or ointment, take the antibiotic or put it on the cut as told by your doctor. Do not stop taking or putting on the antibiotic even if your condition gets better.  Take over-the-counter and prescription medicines only as told by your doctor. General instructions  Limit movement around your cut. This helps healing. ? Avoid straining, lifting, or exercise for the first month, or for as long as told by your doctor. ? Follow instructions from your doctor about going back  to your normal activities. ? Ask your doctor what activities are safe.  Protect your cut from the sun when you are outside for the first 6 months, or for as long as told by your doctor. Put on sunscreen around the scar or cover up the scar.  Keep all follow-up visits as told by your doctor. This is important. Contact a doctor if:  Your have more redness, swelling, or pain around the cut.  You have more fluid or blood coming from the cut.  Your cut feels warm to the touch.  You have pus or a bad smell coming from the cut.  You have a fever or shaking chills.  You feel sick to your stomach (nauseous) or you throw up (vomit).  You are dizzy.  Your stitches or staples come undone. Get help right away if:  You have a red streak coming from your cut.  Your cut bleeds through the bandage and the bleeding does not stop with gentle pressure.  The edges of your cut open up and separate.  You have very bad  (severe) pain.  You have a rash.  You are confused.  You pass out (faint).  You have trouble breathing and you have a fast heartbeat. This information is not intended to replace advice given to you by your health care provider. Make sure you discuss any questions you have with your health care provider. Document Released: 04/16/2011 Document Revised: 09/30/2015 Document Reviewed: 09/30/2015 Elsevier Interactive Patient Education  2017 Elsevier Inc.  Acetaminophen; Hydrocodone tablets or capsules What is this medicine? ACETAMINOPHEN; HYDROCODONE (a set a MEE noe fen; hye droe KOE done) is a pain reliever. It is used to treat moderate to severe pain. This medicine may be used for other purposes; ask your health care provider or pharmacist if you have questions. COMMON BRAND NAME(S): Anexsia, Bancap HC, Ceta-Plus, Co-Gesic, Comfortpak, Dolagesic, [employer] [place], [address], [address], Hydrogesic, [place], Lorcet HD, Lorcet Plus, Lortab, Margesic H, Maxidone, [place], Polygesic, [place], [place], Retail [employer], Vicodin, Vicodin ES, Vicodin HP, [place] Baseman What should I tell my health care provider before I take this medicine? They need to know if you have any of these conditions: -brain tumor -Crohn's disease, inflammatory bowel disease, or ulcerative colitis -drug abuse or addiction -head injury -heart or circulation problems -if you often drink alcohol -kidney disease or problems going to the bathroom -liver disease -lung disease, asthma, or breathing problems -an unusual or allergic reaction to acetaminophen, hydrocodone, other opioid analgesics, other medicines, foods, dyes, or preservatives -pregnant or trying to get pregnant -breast-feeding How should I use this medicine? Take this medicine by mouth with a glass of water. Follow the directions on the prescription label. You can take it with or without food. If it upsets your stomach, take it with food. Do not take your medicine more often  than directed. A special MedGuide will be given to you by the pharmacist with each prescription and refill. Be sure to read this information carefully each time. Talk to your pediatrician regarding the use of this medicine in children. Special care may be needed. Overdosage: If you think you have taken too much of this medicine contact a poison control center or emergency room at once. NOTE: This medicine is only for you. Do not share this medicine with others. What if I miss a dose? If you miss a dose, take it as soon as you can. If it is almost time for your next dose, take only that dose. Do not take  double or extra doses. What may interact with this medicine? This medicine may interact with the following medications: -alcohol -antiviral medicines for HIV or AIDS -atropine -antihistamines for allergy, cough and cold -certain antibiotics like erythromycin, clarithromycin -certain medicines for anxiety or sleep -certain medicines for bladder problems like oxybutynin, tolterodine -certain medicines for depression like amitriptyline, fluoxetine, sertraline -certain medicines for fungal infections like ketoconazole and itraconazole -certain medicines for Parkinson's disease like benztropine, trihexyphenidyl -certain medicines for seizures like carbamazepine, phenobarbital, phenytoin, primidone -certain medicines for stomach problems like dicyclomine, hyoscyamine -certain medicines for travel sickness like scopolamine -general anesthetics like halothane, isoflurane, methoxyflurane, propofol -ipratropium -local anesthetics like lidocaine, pramoxine, tetracaine -MAOIs like Carbex, Eldepryl, Marplan, Nardil, and Parnate -medicines that relax muscles for surgery -other medicines with acetaminophen -other narcotic medicines for pain or cough -phenothiazines like chlorpromazine, mesoridazine, prochlorperazine, thioridazine -rifampin This list may not describe all possible interactions. Give  your health care provider a list of all the medicines, herbs, non-prescription drugs, or dietary supplements you use. Also tell them if you smoke, drink alcohol, or use illegal drugs. Some items may interact with your medicine. What should I watch for while using this medicine? Tell your doctor or health care professional if your pain does not go away, if it gets worse, or if you have new or a different type of pain. You may develop tolerance to the medicine. Tolerance means that you will need a higher dose of the medicine for pain relief. Tolerance is normal and is expected if you take the medicine for a long time. Do not suddenly stop taking your medicine because you may develop a severe reaction. Your body becomes used to the medicine. This does NOT mean you are addicted. Addiction is a behavior related to getting and using a drug for a non-medical reason. If you have pain, you have a medical reason to take pain medicine. Your doctor will tell you how much medicine to take. If your doctor wants you to stop the medicine, the dose will be slowly lowered over time to avoid any side effects. There are different types of narcotic medicines (opiates). If you take more than one type at the same time or if you are taking another medicine that also causes drowsiness, you may have more side effects. Give your health care provider a list of all medicines you use. Your doctor will tell you how much medicine to take. Do not take more medicine than directed. Call emergency for help if you have problems breathing or unusual sleepiness. Do not take other medicines that contain acetaminophen with this medicine. Always read labels carefully. If you have questions, ask your doctor or pharmacist. If you take too much acetaminophen get medical help right away. Too much acetaminophen can be very dangerous and cause liver damage. Even if you do not have symptoms, it is important to get help right away. You may get drowsy or  dizzy. Do not drive, use machinery, or do anything that needs mental alertness until you know how this medicine affects you. Do not stand or sit up quickly, especially if you are an older patient. This reduces the risk of dizzy or fainting spells. Alcohol may interfere with the effect of this medicine. Avoid alcoholic drinks. The medicine will cause constipation. Try to have a bowel movement at least every 2 to 3 days. If you do not have a bowel movement for 3 days, call your doctor or health care professional. Your mouth may get dry. Chewing sugarless gum  or sucking hard candy, and drinking plenty of water may help. Contact your doctor if the problem does not go away or is severe. What side effects may I notice from receiving this medicine? Side effects that you should report to your doctor or health care professional as soon as possible: -allergic reactions like skin rash, itching or hives, swelling of the face, lips, or tongue -breathing problems -confusion -redness, blistering, peeling or loosening of the skin, including inside the mouth -signs and symptoms of low blood pressure like dizziness; feeling faint or lightheaded, falls; unusually weak or tired -trouble passing urine or change in the amount of urine -yellowing of the eyes or skin Side effects that usually do not require medical attention (report to your doctor or health care professional if they continue or are bothersome): -constipation -dry mouth -nausea, vomiting -tiredness This list may not describe all possible side effects. Call your doctor for medical advice about side effects. You may report side effects to FDA at 1-800-FDA-1088. Where should I keep my medicine? Keep out of the reach of children. This medicine can be abused. Keep your medicine in a safe place to protect it from theft. Do not share this medicine with anyone. Selling or giving away this medicine is dangerous and against the law. This medicine may cause  accidental overdose and death if it taken by other adults, children, or pets. Mix any unused medicine with a substance like cat litter or coffee grounds. Then throw the medicine away in a sealed container like a sealed bag or a coffee can with a lid. Do not use the medicine after the expiration date. Store at room temperature between 15 and 30 degrees C (59 and 86 degrees F). NOTE: This sheet is a summary. It may not cover all possible information. If you have questions about this medicine, talk to your doctor, pharmacist, or health care provider.  2018 Elsevier/Gold Standard (2014-10-15 10:02:16)

## 2017-10-10 NOTE — Anesthesia Procedure Notes (Addendum)
Procedure Name: LMA Insertion Date/Time: 10/10/2017 8:16 AM Performed by: Pernell Dupre, Amy A, CRNA Pre-anesthesia Checklist: Patient identified, Emergency Drugs available, Suction available, Patient being monitored and Timeout performed Patient Re-evaluated:Patient Re-evaluated prior to induction Oxygen Delivery Method: Circle system utilized Preoxygenation: Pre-oxygenation with 100% oxygen Induction Type: IV induction Ventilation: Mask ventilation without difficulty LMA: LMA inserted LMA Size: 4.0 Number of attempts: 1 Placement Confirmation: positive ETCO2 and breath sounds checked- equal and bilateral Tube secured with: Tape Dental Injury: Teeth and Oropharynx as per pre-operative assessment

## 2017-10-10 NOTE — Anesthesia Preprocedure Evaluation (Signed)
Anesthesia Evaluation  Patient identified by MRN, date of birth, ID band Patient awake    Reviewed: Allergy & Precautions, H&P , NPO status , Patient's Chart, lab work & pertinent test results, reviewed documented beta blocker date and time   Airway Mallampati: II  TM Distance: >3 FB Neck ROM: full    Dental no notable dental hx. (+) Edentulous Lower, Edentulous Upper   Pulmonary neg pulmonary ROS, Current Smoker,    Pulmonary exam normal breath sounds clear to auscultation       Cardiovascular Exercise Tolerance: Good negative cardio ROS   Rhythm:regular Rate:Normal     Neuro/Psych negative neurological ROS  negative psych ROS   GI/Hepatic negative GI ROS, Neg liver ROS,   Endo/Other  negative endocrine ROS  Renal/GU negative Renal ROS  negative genitourinary   Musculoskeletal   Abdominal   Peds  Hematology negative hematology ROS (+)   Anesthesia Other Findings 1 week s/p same repair with GA LMA- no issues  Reproductive/Obstetrics negative OB ROS                             Anesthesia Physical Anesthesia Plan  ASA: II  Anesthesia Plan: General   Post-op Pain Management:    Induction:   PONV Risk Score and Plan:   Airway Management Planned:   Additional Equipment:   Intra-op Plan:   Post-operative Plan:   Informed Consent: I have reviewed the patients History and Physical, chart, labs and discussed the procedure including the risks, benefits and alternatives for the proposed anesthesia with the patient or authorized representative who has indicated his/her understanding and acceptance.   Dental Advisory Given  Plan Discussed with: CRNA and Anesthesiologist  Anesthesia Plan Comments:         Anesthesia Quick Evaluation

## 2017-10-10 NOTE — Anesthesia Postprocedure Evaluation (Signed)
Anesthesia Post Note  Patient: Patrick Ramsey  Procedure(s) Performed: KNEE ARTHROSCOPY WITH MEDIAL MENISCAL REPAIR (Left Knee)  Patient location during evaluation: Other Anesthesia Type: General Level of consciousness: awake and alert and oriented Pain management: pain level controlled Vital Signs Assessment: post-procedure vital signs reviewed and stable Respiratory status: spontaneous breathing Cardiovascular status: tachycardic Postop Assessment: no apparent nausea or vomiting Anesthetic complications: no     Last Vitals:  Vitals:   10/10/17 1010 10/10/17 1015  BP: (!) 140/101   Pulse: 84 75  Resp: 16 15  Temp:    SpO2: 94% 95%    Last Pain:  Vitals:   10/10/17 1000  TempSrc:   PainSc: 0-No pain                 Ramiz Turpin A

## 2017-10-10 NOTE — Transfer of Care (Signed)
Immediate Anesthesia Transfer of Care Note  Patient: Patrick Ramsey  Procedure(s) Performed: KNEE ARTHROSCOPY WITH MEDIAL MENISCAL REPAIR (Left Knee)  Patient Location: PACU  Anesthesia Type:General  Level of Consciousness: awake, alert , oriented and patient cooperative  Airway & Oxygen Therapy: Patient Spontanous Breathing  Post-op Assessment: Report given to RN and Post -op Vital signs reviewed and stable  Post vital signs: Reviewed and stable  Last Vitals:  Vitals Value Taken Time  BP    Temp    Pulse 101 10/10/2017  9:38 AM  Resp    SpO2 77 % 10/10/2017  9:38 AM  Vitals shown include unvalidated device data.  Last Pain:  Vitals:   10/10/17 0739  TempSrc: Oral  PainSc: 0-No pain      Patients Stated Pain Goal: 8 (10/10/17 0739) No apparent anesthesia complications

## 2017-10-10 NOTE — Op Note (Signed)
10/10/2017  9:28 AM  PATIENT:  Patrick Ramsey  47 y.o. male  PRE-OPERATIVE DIAGNOSIS:  medial meniscus tear left knee  POST-OPERATIVE DIAGNOSIS:  medial meniscus tear left knee  PROCEDURE:  Procedure(s): KNEE ARTHROSCOPY WITH MEDIAL MENISCAL REPAIR (Left) - 29882  Implants 2 meniscal cinch from Arthrex  Findings torn posterior horn medial meniscus red-white junction, vascularity noted in the meniscal rim Remaining portions of the joint were normal  Knee arthroscopy dictation  The patient was identified in the preoperative holding area using 2 approved identification mechanisms. The chart was reviewed and updated. The surgical site was confirmed as left knee and marked with an indelible marker.  The patient was taken to the operating room for anesthesia. After successful general anesthesia, Ancef 2 g was used as IV antibiotics.  The patient was placed in the supine position with the (left) the operative extremity in an arthroscopic leg holder and the opposite extremity in a padded leg holder.  The timeout was executed.  A lateral portal was established with an 11 blade and the scope was introduced into the joint. A diagnostic arthroscopy was performed in circumferential manner examining the entire knee joint. A medial portal was established and the diagnostic arthroscopy was repeated  The torn medial meniscus was still repairable it was approximately a 20 mm long longitudinal tear there was a new radial component to the tear at the junction of the body and posterior horn  The meniscal cinch mplant was placed from the medial portal the first 1 did not deploy and was removed removing both portions of the implant  A second meniscal cinch was placed deployed well and stabilized the posterior portion of the tear.  A third meniscal cinch was placed from the lateral portal and secured the remaining portion of the tear.    The meniscus was viewed from both portals and probed from both portals  and was stable.  The radial tear was debrided and contoured with a arthroscopic wand  The arthroscopic pump was placed on the wash mode and any excess debris was removed from the joint using suction.  60 cc of Marcaine with epinephrine was injected through the arthroscope.  The portals were closed with 3-0 nylon suture.  A sterile bandage, Ace wrap and Cryo/Cuff was placed and the Cryo/Cuff was activated. The patient was taken to the recovery room in stable condition.  SURGEON:  Surgeon(s) and Role:    * Bethenny Losee E, MD - Primary  PHYSICIAN ASSISTANT:   ASSISTANTS: betty ashley    ANESTHESIA:   general  EBL:  5 mL   BLOOD ADMINISTERED:none  DRAINS: none   LOCAL MEDICATIONS USED:  MARCAINE     SPECIMEN:  No Specimen  DISPOSITION OF SPECIMEN:  N/A  COUNTS:  YES  TOURNIQUET:  * Missing tourniquet times found for documented tourniquets in log: [id number] *  DICTATION: .Dragon Dictation  PLAN OF CARE: Discharge to home after PACU  PATIENT DISPOSITION:  PACU - hemodynamically stable.   Delay start of Pharmacological VTE agent (>24hrs) due to surgical blood loss or risk of bleeding: not applicable  

## 2017-10-10 NOTE — Brief Op Note (Signed)
10/10/2017  9:28 AM  PATIENT:  Patrick Ramsey  47 y.o. male  PRE-OPERATIVE DIAGNOSIS:  medial meniscus tear left knee  POST-OPERATIVE DIAGNOSIS:  medial meniscus tear left knee  PROCEDURE:  Procedure(s): KNEE ARTHROSCOPY WITH MEDIAL MENISCAL REPAIR (Left) - 32440  Implants 2 meniscal cinch from Arthrex  Findings torn posterior horn medial meniscus red-white junction, vascularity noted in the meniscal rim Remaining portions of the joint were normal  Knee arthroscopy dictation  The patient was identified in the preoperative holding area using 2 approved identification mechanisms. The chart was reviewed and updated. The surgical site was confirmed as left knee and marked with an indelible marker.  The patient was taken to the operating room for anesthesia. After successful general anesthesia, Ancef 2 g was used as IV antibiotics.  The patient was placed in the supine position with the (left) the operative extremity in an arthroscopic leg holder and the opposite extremity in a padded leg holder.  The timeout was executed.  A lateral portal was established with an 11 blade and the scope was introduced into the joint. A diagnostic arthroscopy was performed in circumferential manner examining the entire knee joint. A medial portal was established and the diagnostic arthroscopy was repeated  The torn medial meniscus was still repairable it was approximately a 20 mm long longitudinal tear there was a new radial component to the tear at the junction of the body and posterior horn  The meniscal cinch mplant was placed from the medial portal the first 1 did not deploy and was removed removing both portions of the implant  A second meniscal cinch was placed deployed well and stabilized the posterior portion of the tear.  A third meniscal cinch was placed from the lateral portal and secured the remaining portion of the tear.    The meniscus was viewed from both portals and probed from both portals  and was stable.  The radial tear was debrided and contoured with a arthroscopic wand  The arthroscopic pump was placed on the wash mode and any excess debris was removed from the joint using suction.  60 cc of Marcaine with epinephrine was injected through the arthroscope.  The portals were closed with 3-0 nylon suture.  A sterile bandage, Ace wrap and Cryo/Cuff was placed and the Cryo/Cuff was activated. The patient was taken to the recovery room in stable condition.  SURGEON:  Surgeon(s) and Role:    * Vickki Hearing, MD - Primary  PHYSICIAN ASSISTANT:   ASSISTANTS: betty ashley    ANESTHESIA:   general  EBL:  5 mL   BLOOD ADMINISTERED:none  DRAINS: none   LOCAL MEDICATIONS USED:  MARCAINE     SPECIMEN:  No Specimen  DISPOSITION OF SPECIMEN:  N/A  COUNTS:  YES  TOURNIQUET:  * Missing tourniquet times found for documented tourniquets in log: [id number] *  DICTATION: .Dragon Dictation  PLAN OF CARE: Discharge to home after PACU  PATIENT DISPOSITION:  PACU - hemodynamically stable.   Delay start of Pharmacological VTE agent (>24hrs) due to surgical blood loss or risk of bleeding: not applicable

## 2017-10-11 ENCOUNTER — Ambulatory Visit: Payer: No Typology Code available for payment source | Admitting: Orthopedic Surgery

## 2017-10-11 ENCOUNTER — Encounter (HOSPITAL_COMMUNITY): Payer: Self-pay | Admitting: Orthopedic Surgery

## 2017-10-18 ENCOUNTER — Encounter: Payer: Self-pay | Admitting: Orthopedic Surgery

## 2017-10-18 ENCOUNTER — Ambulatory Visit (INDEPENDENT_AMBULATORY_CARE_PROVIDER_SITE_OTHER): Payer: No Typology Code available for payment source | Admitting: Orthopedic Surgery

## 2017-10-18 VITALS — BP 152/90 | HR 97 | Ht 70.0 in | Wt 180.0 lb

## 2017-10-18 DIAGNOSIS — Z9889 Other specified postprocedural states: Secondary | ICD-10-CM

## 2017-10-18 NOTE — Progress Notes (Signed)
Chief Complaint  Patient presents with  . Knee Pain    Left knee DOS 10/10/17   Status post meniscal repair with meniscal cinch, postop day #8  Patient is doing well in terms of pain control but is having some numbness and tingling in the bottom foot.  He was advised to be weightbearing as tolerated and start range of motion exercises 0 to 90 degrees however, he elected to keep the weight off his foot  .  His current range of motion is 20 to 80 degrees he has no swelling that took his sutures out  I advised him 0 to 90 degrees full weightbearing and follow-up in 2 weeks  Encounter Diagnosis  Name Primary?  . S/P arthroscopy of left knee 10/10/17, meniscus repair with meniscus cinch Yes

## 2017-11-01 ENCOUNTER — Ambulatory Visit (INDEPENDENT_AMBULATORY_CARE_PROVIDER_SITE_OTHER): Payer: No Typology Code available for payment source | Admitting: Orthopedic Surgery

## 2017-11-01 ENCOUNTER — Encounter: Payer: Self-pay | Admitting: Orthopedic Surgery

## 2017-11-01 VITALS — BP 142/94 | HR 81 | Ht 70.0 in | Wt 172.0 lb

## 2017-11-01 DIAGNOSIS — Z9889 Other specified postprocedural states: Secondary | ICD-10-CM

## 2017-11-01 NOTE — Progress Notes (Signed)
Chief Complaint  Patient presents with  . Knee Pain    10/10/17   Encounter Diagnosis  Name Primary?  . S/P arthroscopy of left knee 10/10/17 Yes   10/10/2017  9:28 AM  PATIENT:  Patrick Ramsey  47 y.o. male  PRE-OPERATIVE DIAGNOSIS:  medial meniscus tear left knee  POST-OPERATIVE DIAGNOSIS:  medial meniscus tear left knee  PROCEDURE:  Procedure(s): KNEE ARTHROSCOPY WITH MEDIAL MENISCAL REPAIR (Left) - 16109  Implants 2 meniscal cinch from Arthrex  Findings torn posterior horn medial meniscus red-white junction, vascularity noted in the meniscal rim Remaining portions of the joint were normal   47 year old male had a meniscal repair 22 days ago so he is at his 3-week postop point he bends his knee 0 to 90 degrees is walking weight-bear as tolerated with a slight limp he has swelling but no pain over his medial meniscus  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  . S/P arthroscopy of left knee 10/10/17 Yes

## 2017-11-01 NOTE — Patient Instructions (Signed)
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

## 2017-11-21 MED FILL — CHANTIX 1 MG TABLET: 1 | 30 days supply | Qty: 60 | Fill #1

## 2017-11-21 MED FILL — SILDENAFIL CITRATE 100 MG T: 100 | 30 days supply | Qty: 6 | Fill #4

## 2017-11-22 ENCOUNTER — Ambulatory Visit (INDEPENDENT_AMBULATORY_CARE_PROVIDER_SITE_OTHER): Payer: No Typology Code available for payment source | Admitting: Orthopedic Surgery

## 2017-11-22 VITALS — BP 167/103 | HR 78 | Ht 70.0 in | Wt 176.0 lb

## 2017-11-22 DIAGNOSIS — Z9889 Other specified postprocedural states: Secondary | ICD-10-CM

## 2017-11-22 NOTE — Patient Instructions (Signed)
Increase knee flexion exercises follow-up in 6 weeks

## 2017-11-22 NOTE — Progress Notes (Signed)
Chief Complaint  Patient presents with  . Knee Injury    Left Knee DOS 10/10/17    10/10/2017  9:28 AM  PATIENT:  Patrick Ramsey  47 y.o. male  PRE-OPERATIVE DIAGNOSIS:  medial meniscus tear left knee  POST-OPERATIVE DIAGNOSIS:  medial meniscus tear left knee  PROCEDURE:  Procedure(s): KNEE ARTHROSCOPY WITH MEDIAL MENISCAL REPAIR (Left) - 78295  Implants 2 meniscal cinch from Arthrex  Findings torn posterior horn medial meniscus red-white junction, vascularity noted in the meniscal rim Remaining portions of the joint were normal  No complaints has good pain relief knee motion is now about 95 degrees minimal swelling no tenderness at the meniscal repair site  Recommend increase knee flexion exercises avoid deep squatting and knee bending follow-up 6 weeks

## 2017-12-20 MED FILL — SILDENAFIL CITRATE 100 MG T: 100 | 30 days supply | Qty: 6 | Fill #5

## 2018-01-06 ENCOUNTER — Ambulatory Visit: Payer: No Typology Code available for payment source | Admitting: Orthopedic Surgery

## 2018-01-08 ENCOUNTER — Ambulatory Visit (INDEPENDENT_AMBULATORY_CARE_PROVIDER_SITE_OTHER): Payer: No Typology Code available for payment source | Admitting: Orthopedic Surgery

## 2018-01-08 ENCOUNTER — Encounter: Payer: Self-pay | Admitting: Orthopedic Surgery

## 2018-01-08 VITALS — BP 179/109 | HR 73 | Ht 70.0 in | Wt 181.0 lb

## 2018-01-08 DIAGNOSIS — Z9889 Other specified postprocedural states: Secondary | ICD-10-CM

## 2018-01-08 NOTE — Progress Notes (Signed)
Verbal.  Surgery date 10/10/2017  Meniscal repair left knee  Complains of dull ache on stairclimbing otherwise doing well   No swelling in the knee joint full range of motion no meniscal signs normal gait  He did have a question about a knot on the plantar aspect left foot feels like a fibroma approximately 2 cm in diameter subcutaneous slightly tender, does not want anything done about it at this time  Routine activities follow-up as needed

## 2018-01-20 MED FILL — SILDENAFIL CITRATE 100 MG T: 100 | 30 days supply | Qty: 6 | Fill #6

## 2018-01-30 ENCOUNTER — Telehealth: Payer: Self-pay | Admitting: Orthopedic Surgery

## 2018-01-30 NOTE — Telephone Encounter (Signed)
Fannie KneeSue from [facility]  [phone] called this afternoon questioning whether or not we had gotten prior authorization for this patient's surgery that was done on 10-03-17.     She wanted to speak with whomever handles prior authorizations here in our office.  I told her that I would have to have someone call her back as I was not the one that could help her.  Please call her when you time please  Thanks

## 2018-01-31 NOTE — Telephone Encounter (Signed)
Please send Patrick Ramsey an email  This is top priority

## 2018-01-31 NOTE — Telephone Encounter (Signed)
[facility] explained surgery was approved for 09/24/17, when Dr Romeo AppleHarrison got into the knee joint, he was told he did not have the equipment he needed to do the meniscal repair, so he had to close the knee and reschedule the case for 10/10/17.  Patient has gotten a bill for $3600 from hospital for his first surgery, insurance is refusing to pay.

## 2018-02-03 NOTE — Telephone Encounter (Signed)
I called patient to advise we are working on this for him. He also states he has received a bill for the first office visit when he was here to see Dr Hilda LiasKeeling. I have given [facility] the [facility] and advised him to call his insurance company to discuss.

## 2018-03-19 MED FILL — SILDENAFIL CITRATE 100 MG T: 100 | 30 days supply | Qty: 6 | Fill #7 | Status: TO

## 2018-05-28 MED FILL — SILDENAFIL CITRATE 100 MG T: 100 | 30 days supply | Qty: 6 | Fill #0

## 2018-07-23 ENCOUNTER — Other Ambulatory Visit: Payer: Self-pay

## 2018-07-23 ENCOUNTER — Telehealth: Payer: Self-pay | Admitting: Physician Assistant

## 2018-07-23 DIAGNOSIS — N529 Male erectile dysfunction, unspecified: Secondary | ICD-10-CM

## 2018-07-23 MED ORDER — SILDENAFIL CITRATE 100 MG PO TABS: 100.0000 mg | ORAL_TABLET | ORAL | 0 refills | Status: DC | PRN

## 2018-07-23 MED FILL — SILDENAFIL CITRATE 100 MG T: 100 | 25 days supply | Qty: 5 | Fill #0

## 2018-07-23 NOTE — Telephone Encounter (Signed)
Patient says refill request have been sent over and needs these  Please call and advise if can be refilled or not [phone]

## 2018-08-05 ENCOUNTER — Ambulatory Visit: Payer: Self-pay | Admitting: Family Medicine

## 2018-08-12 ENCOUNTER — Other Ambulatory Visit: Payer: Self-pay

## 2018-08-12 ENCOUNTER — Ambulatory Visit (INDEPENDENT_AMBULATORY_CARE_PROVIDER_SITE_OTHER): Payer: No Typology Code available for payment source | Admitting: Family Medicine

## 2018-08-12 ENCOUNTER — Encounter: Payer: Self-pay | Admitting: Family Medicine

## 2018-08-12 VITALS — BP 128/72 | HR 84 | Temp 98.2°F | Resp 12 | Ht 70.0 in | Wt 173.0 lb

## 2018-08-12 DIAGNOSIS — Z0001 Encounter for general adult medical examination with abnormal findings: Secondary | ICD-10-CM | POA: Diagnosis not present

## 2018-08-12 DIAGNOSIS — Z0000 Encounter for general adult medical examination without abnormal findings: Secondary | ICD-10-CM

## 2018-08-12 DIAGNOSIS — N529 Male erectile dysfunction, unspecified: Secondary | ICD-10-CM | POA: Diagnosis not present

## 2018-08-12 DIAGNOSIS — F17200 Nicotine dependence, unspecified, uncomplicated: Secondary | ICD-10-CM

## 2018-08-12 DIAGNOSIS — J310 Chronic rhinitis: Secondary | ICD-10-CM | POA: Diagnosis not present

## 2018-08-12 DIAGNOSIS — Z716 Tobacco abuse counseling: Secondary | ICD-10-CM

## 2018-08-12 MED ORDER — SILDENAFIL CITRATE 100 MG PO TABS: 100.0000 mg | ORAL_TABLET | ORAL | 5 refills | Status: DC | PRN

## 2018-08-12 MED ORDER — CHANTIX STARTING MONTH PAK 0.5 MG X 11 & 1 MG X 42 PO TABS: ORAL_TABLET | ORAL | 0 refills | Status: DC

## 2018-08-12 MED FILL — SILDENAFIL CITRATE 100 MG T: 100 | 30 days supply | Qty: 6 | Fill #0

## 2018-08-12 MED FILL — CHANTIX STARTING MONTH BOX: 0.5 MG X 11 | 30 days supply | Qty: 53 | Fill #0

## 2018-08-12 NOTE — Patient Instructions (Signed)

## 2018-08-12 NOTE — Progress Notes (Signed)
Patient: Patrick Ramsey, Male    DOB: 10-Jul-1970, 48 y.o.   MRN: [id number] Visit Date: 08/12/2018  Today's Provider: Delsa Grana, PA-C   Chief Complaint  Patient presents with  . Annual Exam    is fasting   Subjective:    Annual physical exam Patrick Ramsey is a 48 y.o. male who presents today for health maintenance and complete physical. He feels well. 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 He reports he is sleeping well- back to first shift.  -----------------------------------------------------------------  ED - same, uses 50 mg only a few times a month   Chantix - took it in the past, still has some, when he took in the past it was effective w/o adverse SE. Smokes 1 ppd, 26 pack year hx, is getting a "smokers cough" no exertional dyspnea, cough not productive. Denies CP, SOB, wheeze, unintentional weight loss.    Knee surgery's both, knees doing well -  Right shoulder has pain, notices when trying to work out or doing strenuous work with employment.  Does not want physical therapy, he has most joints that pop and feel stiff.   Review of Systems  Constitutional: Negative.   HENT: Negative.   Eyes: Negative.   Respiratory: Negative.   Cardiovascular: Negative.   Gastrointestinal: Negative.   Endocrine: Negative.   Genitourinary: Negative.   Musculoskeletal: Negative.   Skin: Negative.   Allergic/Immunologic: Negative.   Neurological: Negative.   Hematological: Negative.   Psychiatric/Behavioral: Negative.   All other systems reviewed and are negative.   Social History      He  reports that he has been smoking cigarettes. He started smoking about 31 years ago. He has a 27.00 pack-year smoking history. He has never used smokeless tobacco. He reports current alcohol use of about 12.0 standard drinks of alcohol per week. He reports that he does not use drugs.       Social History   Socioeconomic History  . Marital status:  Married    Spouse name: Not on file  . Number of children: Not on file  . Years of education: Not on file  . Highest education level: Not on file  Occupational History  . Not on file  Social Needs  . Financial resource strain: Not on file  . Food insecurity    Worry: Not on file    Inability: Not on file  . Transportation needs    Medical: Not on file    Non-medical: Not on file  Tobacco Use  . Smoking status: Current Every Day Smoker    Packs/day: 1.00    Years: 27.00    Pack years: 27.00    Types: Cigarettes    Start date: 10/17/1986  . Smokeless tobacco: Never Used  Substance and Sexual Activity  . Alcohol use: Yes    Alcohol/week: 12.0 standard drinks    Types: 12 Cans of beer per week    Comment: 12 cans on weekend  . Drug use: No  . Sexual activity: Yes  Lifestyle  . Physical activity    Days per week: Not on file    Minutes per session: Not on file  . Stress: Not on file  Relationships  . Social Herbalist on phone: Not on file    Gets together: Not on file    Attends religious service: Not on file    Active member of club or organization: Not on  file    Attends meetings of clubs or organizations: Not on file    Relationship status: Not on file  Other Topics Concern  . Not on file  Social History Narrative  . Not on file    History reviewed. No pertinent past medical history.   Patient Active Problem List   Diagnosis Date Noted  . S/P arthroscopy of left knee 10/10/17 (meniscal repair)   . Peripheral tear of medial meniscus of left knee as current injury   . Smoker 10/17/2015  . Encounter for smoking cessation counseling 10/17/2015    Past Surgical History:  Procedure Laterality Date  . KNEE ARTHROSCOPY WITH MEDIAL MENISECTOMY Left 10/03/2017   Procedure: KNEE ARTHROSCOPY WITH MEDIAL MENISECTOMY DIAGNOSTIC;  Surgeon: Vickki HearingHarrison, Stanley E, MD;  Location: [facility];  Service: Orthopedics;  Laterality: Left;  . KNEE ARTHROSCOPY WITH MENISCAL  REPAIR Left 10/10/2017   Procedure: KNEE ARTHROSCOPY WITH MEDIAL MENISCAL REPAIR;  Surgeon: Vickki HearingHarrison, Stanley E, MD;  Location: [facility];  Service: Orthopedics;  Laterality: Left;    Family History        Family Status  Relation Name Status  . Mother  Alive  . Father  Alive  . Sister  Alive  . Brother  Alive  . Sister  Alive  . Brother  Alive        His family history includes Hypertension in his father; Uterine cancer in his mother.      No Known Allergies   Current Outpatient Medications:  .  ibuprofen (ADVIL) 200 MG tablet, Take 400 mg by mouth every 6 (six) hours as needed., Disp: , Rfl:  .  mometasone (NASONEX) 50 MCG/ACT nasal spray, Place 2 sprays into the nose daily., Disp: 17 g, Rfl: 12 .  sildenafil (VIAGRA) 100 MG tablet, Take 1 tablet (100 mg total) by mouth as needed for erectile dysfunction., Disp: 6 tablet, Rfl: 5 .  CHANTIX 1 MG tablet, TAKE 1 TABLET BY MOUTH 2 TIMES DAILY. (Patient not taking: No sig reported), Disp: 60 tablet, Rfl: 3 .  varenicline (CHANTIX STARTING MONTH PAK) 0.5 MG X 11 & 1 MG X 42 tablet, Take 0.5 mg tablet by mouth 1x daily for 3 days, then increase to 0.5 mg tablet 2x daily for 4 days, then increase to 1 mg tablet 2x daily., Disp: 53 tablet, Rfl: 0   Patient Care Team: Danelle Berryapia, Trevone Prestwood, PA-C as PCP - General (Family Medicine)      Objective:   Vitals: BP 128/72   Pulse 84   Temp 98.2 F (36.8 C) (Oral)   Resp 12   Ht 5\' 10"  (1.778 m)   Wt 173 lb (78.5 kg)   SpO2 97%   BMI 24.82 kg/m    Vitals:   08/12/18 0838  BP: 128/72  Pulse: 84  Resp: 12  Temp: 98.2 F (36.8 C)  TempSrc: Oral  SpO2: 97%  Weight: 173 lb (78.5 kg)  Height: 5\' 10"  (1.778 m)     Physical Exam Constitutional:      General: He is not in acute distress.    Appearance: Normal appearance. He is well-developed and normal weight. He is not ill-appearing, toxic-appearing or diaphoretic.  HENT:     Head: Normocephalic and atraumatic.     Jaw: No trismus.      Right Ear: Tympanic membrane, ear canal and external ear normal. There is no impacted cerumen.     Left Ear: Tympanic membrane, ear canal and external ear normal. There is no impacted  cerumen.     Nose: Congestion and rhinorrhea present. No mucosal edema.     Right Sinus: No maxillary sinus tenderness or frontal sinus tenderness.     Left Sinus: No maxillary sinus tenderness or frontal sinus tenderness.     Mouth/Throat:     Mouth: Mucous membranes are moist.     Pharynx: Uvula midline. Posterior oropharyngeal erythema present. No oropharyngeal exudate or uvula swelling.  Eyes:     General: Lids are normal.        Right eye: No discharge.        Left eye: No discharge.     Conjunctiva/sclera: Conjunctivae normal.     Pupils: Pupils are equal, round, and reactive to light.  Neck:     Musculoskeletal: Normal range of motion and neck supple.     Thyroid: No thyroid mass, thyromegaly or thyroid tenderness.     Vascular: No carotid bruit.     Trachea: Trachea and phonation normal. No tracheal deviation.  Cardiovascular:     Rate and Rhythm: Normal rate and regular rhythm.     Pulses: Normal pulses.          Radial pulses are 2+ on the right side and 2+ on the left side.       Posterior tibial pulses are 2+ on the right side and 2+ on the left side.     Heart sounds: Normal heart sounds. No murmur. No friction rub. No gallop.   Pulmonary:     Effort: Pulmonary effort is normal.     Breath sounds: Normal breath sounds. No wheezing, rhonchi or rales.  Abdominal:     General: Bowel sounds are normal. There is no distension.     Palpations: Abdomen is soft.     Tenderness: There is no abdominal tenderness. There is no guarding or rebound.  Musculoskeletal:     Right lower leg: No edema.     Left lower leg: No edema.  Lymphadenopathy:     Cervical: No cervical adenopathy.  Skin:    General: Skin is warm and dry.     Capillary Refill: Capillary refill takes less than 2 seconds.      Coloration: Skin is not jaundiced or pale.     Findings: No bruising or rash.  Neurological:     Mental Status: He is alert and oriented to person, place, and time.     Sensory: No sensory deficit.     Motor: No weakness.     Coordination: Coordination normal.     Gait: Gait normal.  Psychiatric:        Mood and Affect: Mood normal.        Speech: Speech normal.        Behavior: Behavior normal.        Thought Content: Thought content normal.      Depression Screen PHQ 2/9 Scores 08/12/2018 08/01/2017 07/30/2016 10/17/2015  PHQ - 2 Score 0 0 0 0  PHQ- 9 Score - - 0 0      [facility]  Alcohol Use Disorder Identification Test Final Score (AUDIT)  6       Assessment & Plan:     Routine Health Maintenance and Physical Exam  Exercise Activities and Dietary recommendations Goals    . Exercise 3x per week (30 min per time)     3-5x a week, increase aerobic exercise - AHA recommendations printed with AVS      Discussed  health benefits of physical activity, and encouraged him to engage in regular exercise appropriate for his age and condition.   Immunization History  Administered Date(s) Administered  . Influenza Inj Mdck Quad Pf 11/19/2017  . Influenza,inj,Quad PF,6+ Mos 11/22/2016, 11/19/2017  . Influenza-Unspecified 11/18/2015, 11/22/2016  . Tdap 06/03/2014    Health Maintenance  Topic Date Due  . HIV Screening  08/12/2019 (Originally 03/25/1987)  . INFLUENZA VACCINE  09/06/2018  . TETANUS/TDAP  06/02/2024   Flu done at work annually Declines HIV Refill on chantix started pack, counseled about smoking cardiovascular risks and cancer risks, currently smokers cough but no dyspnea or exertional sx.  Counseled to quit, medications reviewed, all questions asked and answered, refill on chantix.   Alcohol and depression screening done - depression neg, alcohol screening <7    ICD-10-CM   1. Encounter for preventive health  examination  Z00.00 CBC with Differential/Platelet    COMPLETE METABOLIC PANEL WITH GFR    Lipid panel  2. Smoker  F17.200    smoking cessation counseling given and rx for chantix  3. Erectile dysfunction, unspecified erectile dysfunction type  N52.9 sildenafil (VIAGRA) 100 MG tablet   no change, viagra 50 mg still working well, no LUTS  4. Chronic rhinitis  J31.0    nasal discharge and erythema to nose and throat, viral vs acute on chronic allergic rhinitis? asx other than nasal congestion unchanged from baseline  5. Encounter for smoking cessation counseling  Z71.6          Danelle BerryLeisa Jettie Mannor, PA-C 08/12/18 10:02 AM  [facility]

## 2018-08-13 ENCOUNTER — Telehealth: Payer: Self-pay

## 2018-08-13 ENCOUNTER — Encounter: Payer: Self-pay | Admitting: Family Medicine

## 2018-08-13 DIAGNOSIS — D582 Other hemoglobinopathies: Secondary | ICD-10-CM | POA: Insufficient documentation

## 2018-08-13 DIAGNOSIS — E785 Hyperlipidemia, unspecified: Secondary | ICD-10-CM | POA: Insufficient documentation

## 2018-08-13 LAB — COMPLETE METABOLIC PANEL WITH GFR
AG Ratio: 2 (calc) (ref 1.0–2.5)
ALT: 26 U/L (ref 9–46)
AST: 28 U/L (ref 10–40)
Albumin: 4.6 g/dL (ref 3.6–5.1)
Alkaline phosphatase (APISO): 90 U/L (ref 36–130)
BUN: 11 mg/dL (ref 7–25)
CO2: 27 mmol/L (ref 20–32)
Calcium: 9.8 mg/dL (ref 8.6–10.3)
Chloride: 102 mmol/L (ref 98–110)
Creat: 1.06 mg/dL (ref 0.60–1.35)
GFR, Est African American: 97 mL/min/{1.73_m2} (ref >=60)
GFR, Est Non African American: 84 mL/min/{1.73_m2} (ref >=60)
Globulin: 2.3 g/dL (calc) (ref 1.9–3.7)
Glucose, Bld: 103 mg/dL — ABNORMAL HIGH (ref 65–99)
Potassium: 4.5 mmol/L (ref 3.5–5.3)
Sodium: 134 mmol/L — ABNORMAL LOW (ref 135–146)
Total Bilirubin: 0.4 mg/dL (ref 0.2–1.2)
Total Protein: 6.9 g/dL (ref 6.1–8.1)

## 2018-08-13 LAB — LIPID PANEL
Cholesterol: 213 mg/dL — ABNORMAL HIGH (ref <200)
HDL: 59 mg/dL (ref >=40)
LDL Cholesterol (Calc): 134 mg/dL (calc) — ABNORMAL HIGH
Non-HDL Cholesterol (Calc): 154 mg/dL (calc) — ABNORMAL HIGH (ref <130)
Total CHOL/HDL Ratio: 3.6 (calc) (ref <5.0)
Triglycerides: 102 mg/dL (ref <150)

## 2018-08-13 LAB — CBC WITH DIFFERENTIAL/PLATELET
Absolute Monocytes: 538 cells/uL (ref 200–950)
Basophils Absolute: 90 cells/uL (ref 0–200)
Basophils Relative: 1.6 %
Eosinophils Absolute: 162 cells/uL (ref 15–500)
Eosinophils Relative: 2.9 %
HCT: 50.1 % — ABNORMAL HIGH (ref 38.5–50.0)
Hemoglobin: 17.8 g/dL — ABNORMAL HIGH (ref 13.2–17.1)
Lymphs Abs: 1249 cells/uL (ref 850–3900)
MCH: 32.3 pg (ref 27.0–33.0)
MCHC: 35.5 g/dL (ref 32.0–36.0)
MCV: 90.9 fL (ref 80.0–100.0)
MPV: 10.1 fL (ref 7.5–12.5)
Monocytes Relative: 9.6 %
Neutro Abs: 3562 cells/uL (ref 1500–7800)
Neutrophils Relative %: 63.6 %
Platelets: 235 10*3/uL (ref 140–400)
RBC: 5.51 10*6/uL (ref 4.20–5.80)
RDW: 12.8 % (ref 11.0–15.0)
Total Lymphocyte: 22.3 %
WBC: 5.6 10*3/uL (ref 3.8–10.8)

## 2018-08-13 MED ORDER — ATORVASTATIN CALCIUM 10 MG PO TABS: 10.0000 mg | ORAL_TABLET | Freq: Every day | ORAL | 2 refills | Status: DC

## 2018-08-13 MED FILL — ATORVASTATIN 10 MG TABLET: 10 | 30 days supply | Qty: 30 | Fill #0

## 2018-08-13 NOTE — Telephone Encounter (Signed)
Rx sent to pharmacy for lipitor 10 mg.

## 2018-10-03 MED FILL — SILDENAFIL CITRATE 100 MG T: 100 | 30 days supply | Qty: 6 | Fill #1

## 2018-11-11 ENCOUNTER — Encounter: Payer: Self-pay | Admitting: Family Medicine

## 2018-11-11 ENCOUNTER — Other Ambulatory Visit: Payer: Self-pay

## 2018-11-11 ENCOUNTER — Ambulatory Visit (INDEPENDENT_AMBULATORY_CARE_PROVIDER_SITE_OTHER): Payer: No Typology Code available for payment source | Admitting: Family Medicine

## 2018-11-11 VITALS — BP 120/68 | HR 80 | Temp 98.4°F | Resp 14 | Ht 70.0 in | Wt 180.0 lb

## 2018-11-11 DIAGNOSIS — M79672 Pain in left foot: Secondary | ICD-10-CM | POA: Diagnosis not present

## 2018-11-11 DIAGNOSIS — G5762 Lesion of plantar nerve, left lower limb: Secondary | ICD-10-CM | POA: Diagnosis not present

## 2018-11-11 DIAGNOSIS — E782 Mixed hyperlipidemia: Secondary | ICD-10-CM

## 2018-11-11 MED ORDER — ATORVASTATIN CALCIUM 10 MG PO TABS: 10.0000 mg | ORAL_TABLET | Freq: Every day | ORAL | 2 refills | Status: DC

## 2018-11-11 MED ORDER — MELOXICAM 15 MG PO TABS: 15.0000 mg | ORAL_TABLET | Freq: Every day | ORAL | 2 refills | Status: DC

## 2018-11-11 MED ORDER — MELOXICAM 15 MG PO TABS: 15.0000 mg | ORAL_TABLET | Freq: Every day | ORAL | 0 refills | Status: DC

## 2018-11-11 MED FILL — ATORVASTATIN 10 MG TABLET: 10 | 30 days supply | Qty: 30 | Fill #0

## 2018-11-11 MED FILL — MELOXICAM 15 MG TABLET: 15 | 30 days supply | Qty: 30 | Fill #0

## 2018-11-11 NOTE — Patient Instructions (Signed)
Restart cholesterol medication Take meloxicam once a day  Referral to podiatry  F/U Lab visit in 3 months

## 2018-11-11 NOTE — Assessment & Plan Note (Signed)
Pt to start lipitor 10mg , reassess cholesterol and LFT in 3 months

## 2018-11-11 NOTE — Progress Notes (Signed)
   Subjective:    Patient ID: Patrick Ramsey, male    DOB: 12/16/1970, 48 y.o.   MRN: [id number]  Patient presents for L Foot Pain (x1 day- pain at toes under 3rd toe- sharp pain while walking)  With left foot pain for the past few days.  He states that when he walks he feels like there is something in his shoe right beneath the third and fourth digit but he cannot feel anything.  He has not noticed any callus no drainage no redness.  The pain will shoot back towards his heel at times.  He has not had any injury to the foot.  There is been no swelling.  He did take some ibuprofen yesterday which helped a little bit.  Also reviewed his recent fasting labs from July.  He was started on Lipitor but states that he only took it a day or so wanted to ask more questions and review his labs in detail.  I reviewed his labs with him at bedside.  He does admit to some dietary indiscretions.  His cholesterol has been trending up the past few years and he smokes a pack per day.  He is willing to take the Lipitor  Review Of Systems:  GEN- denies fatigue, fever, weight loss,weakness, recent illness HEENT- denies eye drainage, change in vision, nasal discharge, CVS- denies chest pain, palpitations RESP- denies SOB, cough, wheeze ABD- denies N/V, change in stools, abd pain GU- denies dysuria, hematuria, dribbling, incontinence MSK- + joint pain, muscle aches, injury Neuro- denies headache, dizziness, syncope, seizure activity       Objective:    BP 120/68   Pulse 80   Temp 98.4 F (36.9 C) (Oral)   Resp 14   Ht 5\' 10"  (1.778 m)   Wt 180 lb (81.6 kg)   SpO2 97%   BMI 25.83 kg/m  GEN- NAD, alert and oriented x3 Ext- no edema  MSK- FROM FOOT, TTP Between base of 3rd and 4th digit, no visible lesion or callus Pulses- Radial, DP- 2+        Assessment & Plan:      Problem List Items Addressed This Visit      Unprioritized   HLD (hyperlipidemia)    Pt to start lipitor 10mg , reassess  cholesterol and LFT in 3 months      Relevant Medications   atorvastatin (LIPITOR) 10 MG tablet    Other Visit Diagnoses    Left foot pain    -  Primary   possible morton neuroma, will start mobic daily, send to podiatry   Relevant Orders   Ambulatory referral to Podiatry   Morton neuroma, left       Relevant Orders   Ambulatory referral to Podiatry      Note: This dictation was prepared with Dragon dictation along with smaller phrase technology. Any transcriptional errors that result from this process are unintentional.

## 2018-11-21 MED FILL — SILDENAFIL CITRATE 100 MG T: 100 | 30 days supply | Qty: 6 | Fill #2

## 2018-11-26 ENCOUNTER — Encounter: Payer: Self-pay | Admitting: Podiatry

## 2018-11-26 ENCOUNTER — Ambulatory Visit (INDEPENDENT_AMBULATORY_CARE_PROVIDER_SITE_OTHER): Payer: No Typology Code available for payment source | Admitting: Podiatry

## 2018-11-26 ENCOUNTER — Other Ambulatory Visit: Payer: Self-pay

## 2018-11-26 ENCOUNTER — Ambulatory Visit (INDEPENDENT_AMBULATORY_CARE_PROVIDER_SITE_OTHER): Payer: No Typology Code available for payment source

## 2018-11-26 VITALS — BP 155/105 | HR 85 | Resp 16

## 2018-11-26 DIAGNOSIS — M779 Enthesopathy, unspecified: Secondary | ICD-10-CM | POA: Diagnosis not present

## 2018-11-27 NOTE — Progress Notes (Signed)
Subjective:   Patient ID: Patrick Ramsey, male   DOB: 48 y.o.   MRN: [id number]   HPI Patient presents with pain to the forefoot left and states that it has been going on for several months and he did start meloxicam which has improved it some and got new shoes which has been beneficial but still having mild to moderate discomfort that he wanted to get checked.  Patient does smoke a pack per day and likes to be active   Review of Systems  All other systems reviewed and are negative.       Objective:  Physical Exam Vitals signs and nursing note reviewed.  Constitutional:      Appearance: He is well-developed.  Pulmonary:     Effort: Pulmonary effort is normal.  Musculoskeletal: Normal range of motion.  Skin:    General: Skin is warm.  Neurological:     Mental Status: He is alert.     Neurovascular status found to be intact muscle strength was found to be adequate with patient noted to have quite a bit of discomfort third MPJ left with mild fluid in the joint but reduced from where it was previously with patient stating that the pain is nowhere near as severe as it was in the last month    Assessment:  Acute capsulitis left that is moderately improved with still discomfort noted     Plan:  H&P x-ray reviewed and today have recommended utilization of rigid bottom shoes stretching exercises continued anti-inflammatory usage and if symptoms were to get worse patient will need to consider other more aggressive treatments which I educated him on today  X-rays indicate that there is no signs of stress fracture arthritis or issues with the metatarsal parabola of the left foot

## 2018-12-15 ENCOUNTER — Other Ambulatory Visit: Payer: Self-pay | Admitting: Family Medicine

## 2018-12-15 NOTE — Telephone Encounter (Signed)
Last refilled: 11/11/2018 Last office visit: 11/11/2018

## 2018-12-16 MED FILL — MELOXICAM 15 MG TABLET: 15 | 30 days supply | Qty: 30 | Fill #0

## 2019-01-22 IMAGING — MR MR KNEE*L* W/O CM
4 of 6 series · 21 of 40 positions shown · non-contrast
Comparison: Left knee x-rays dated August 20, 2017.

CLINICAL DATA: Chronic, diffuse left knee pain with mechanical
symptoms since football injury a few years ago.

EXAM:
MRI OF THE LEFT KNEE WITHOUT CONTRAST
TECHNIQUE: Multiplanar, multisequence MR imaging of the knee was performed. No
intravenous contrast was administered.

[Series 3: t2fs axial · axial · 4.0mm · 0.45mm/px · z∈[-68,+57]mm · 7 of 26 slices shown]
[im 1/26]
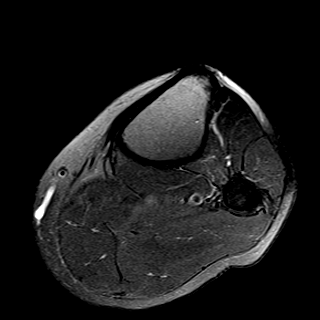
[im 5/26]
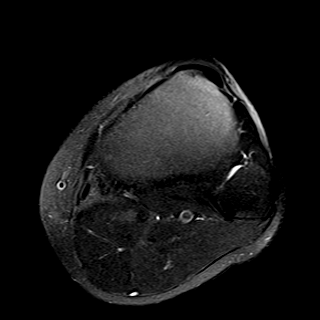
[im 9/26]
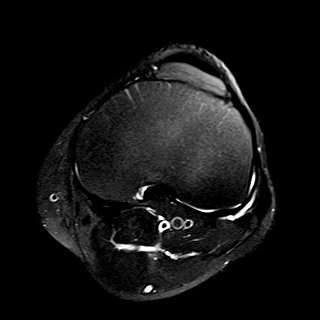
[im 13/26]
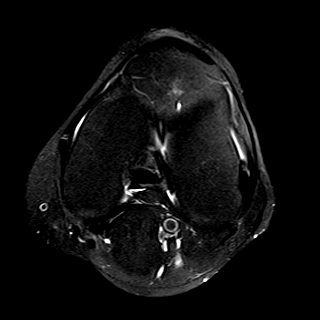
[im 17/26]
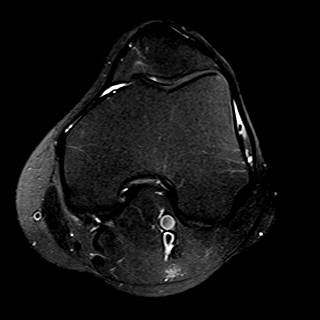
[im 21/26]
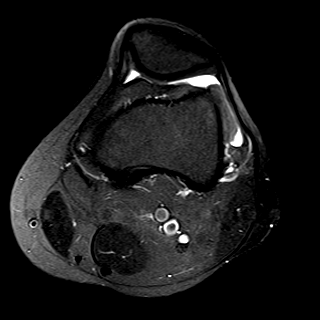
[im 26/26]
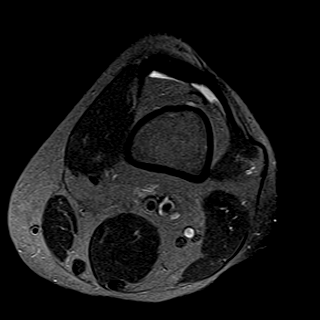

[Series 4: T1 · coronal · 4.0mm · 0.26mm/px · 7 of 25 slices shown]
[im 1/25]
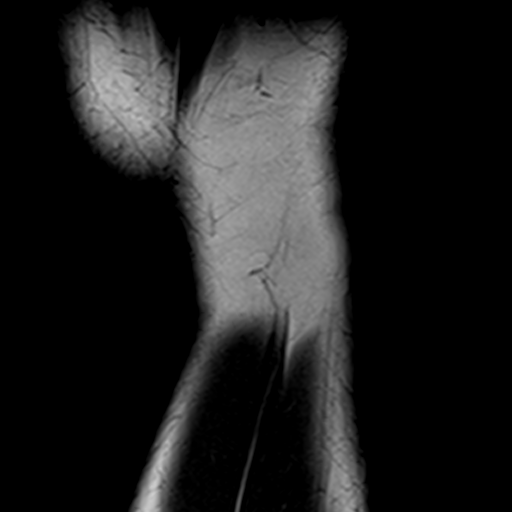
[im 5/25]
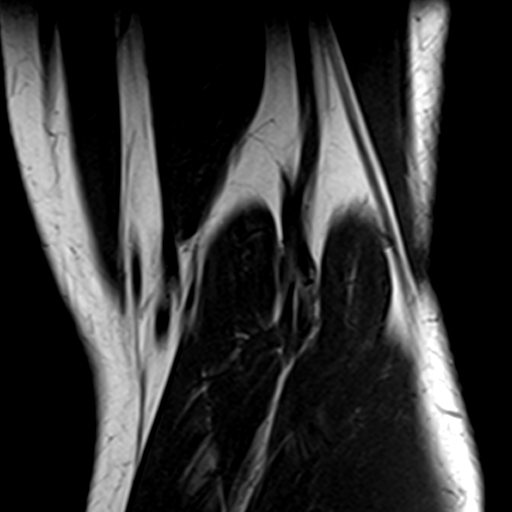
[im 9/25]
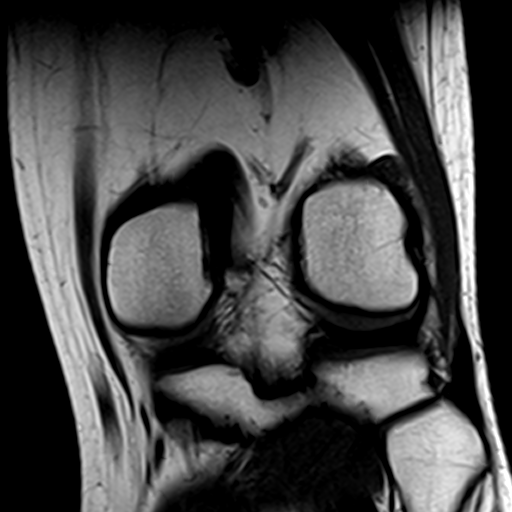
[im 13/25]
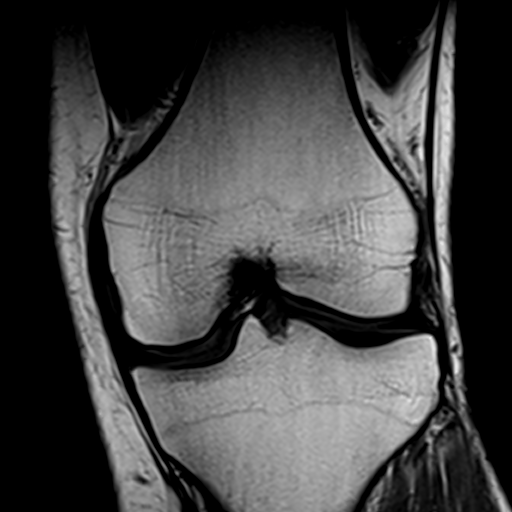
[im 17/25]
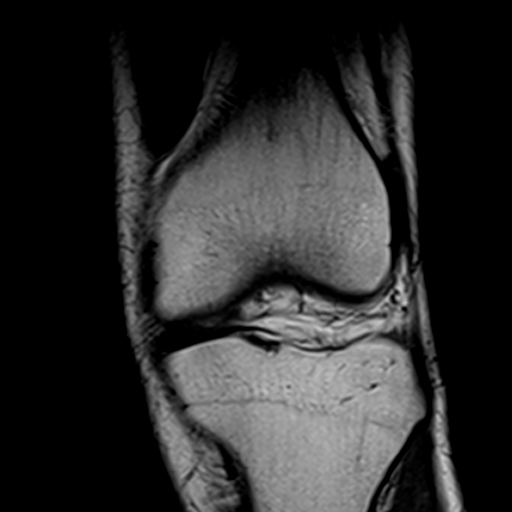
[im 21/25]
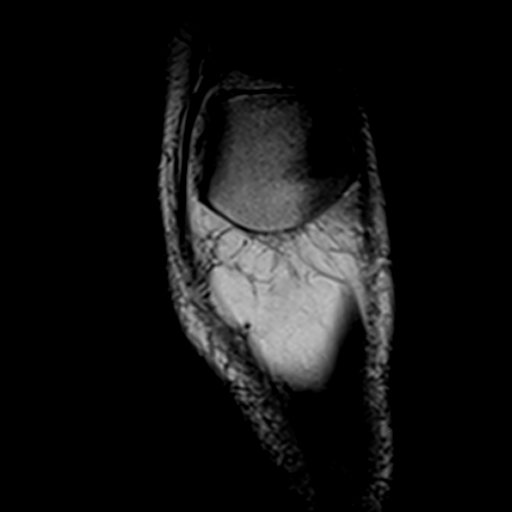
[im 25/25]
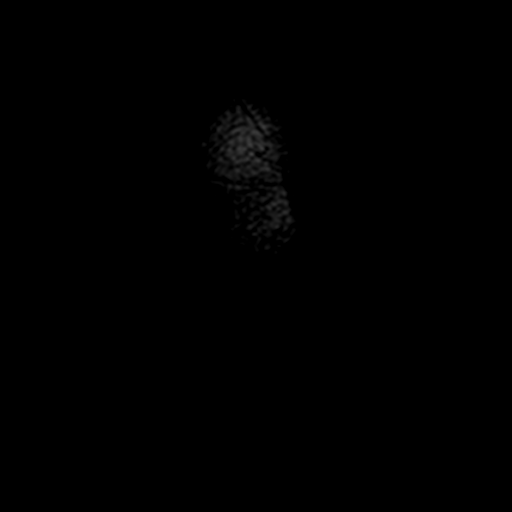

[Series 5: pdfs sag · sagittal · 3.0mm · 0.23mm/px · 4 of 29 slices shown]
[im 1/29]
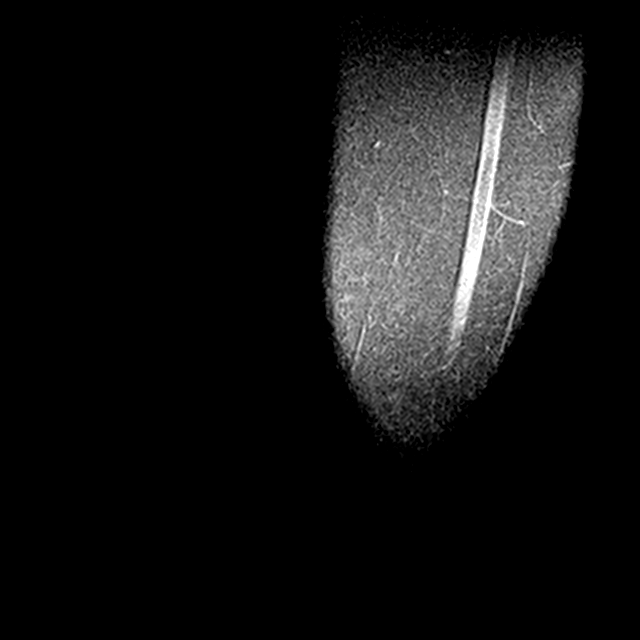
[im 5/29]
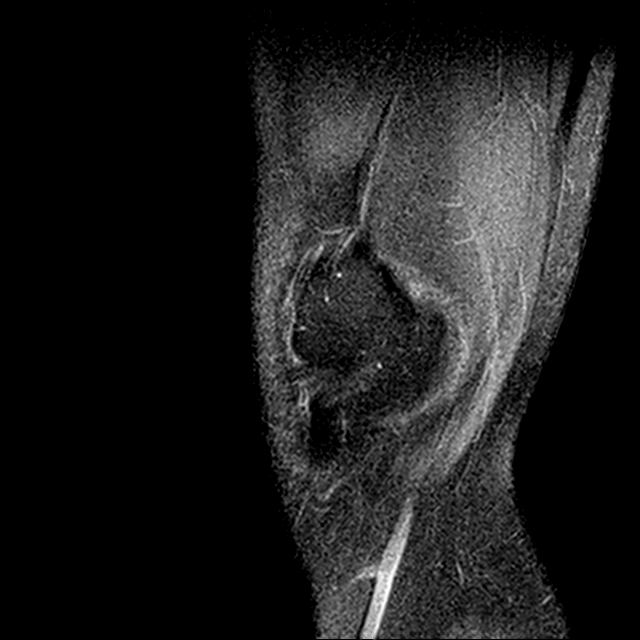
[im 17/29]
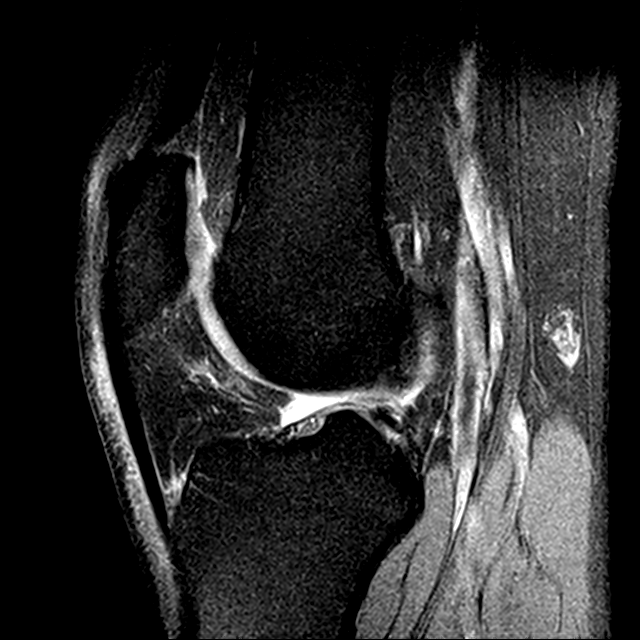
[im 25/29]
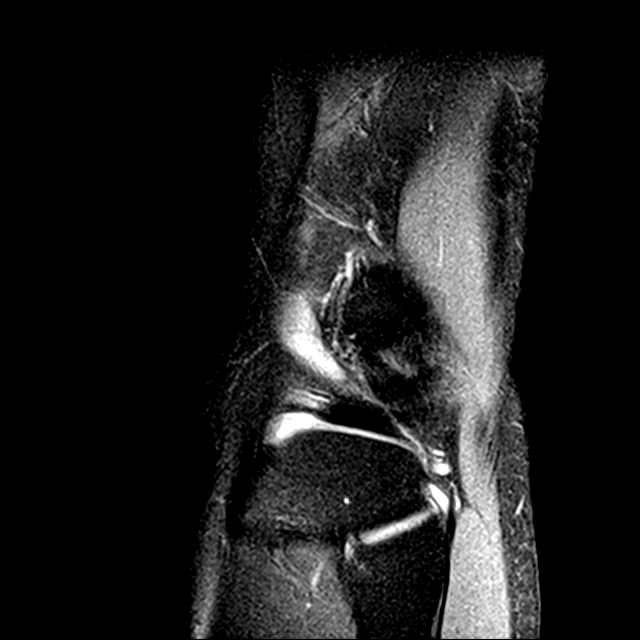

[Series 6: t2fs cor · coronal · 4.0mm · 0.27mm/px · 3 of 25 slices shown]
[im 5/25]
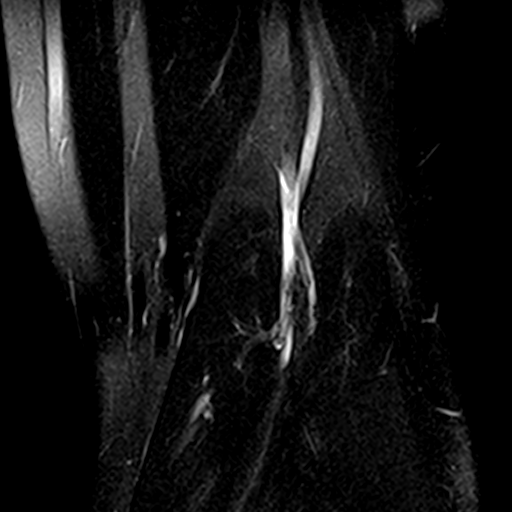
[im 13/25]
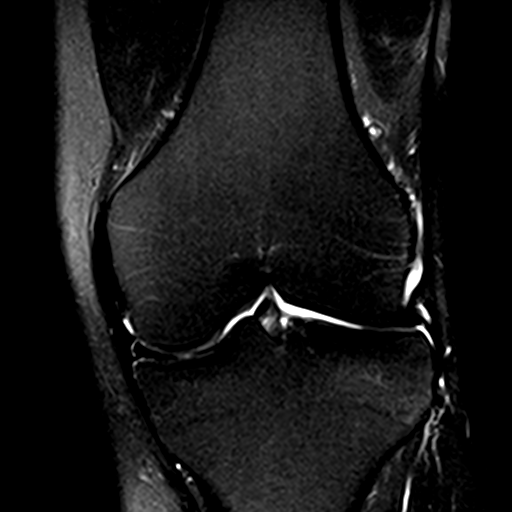
[im 21/25]
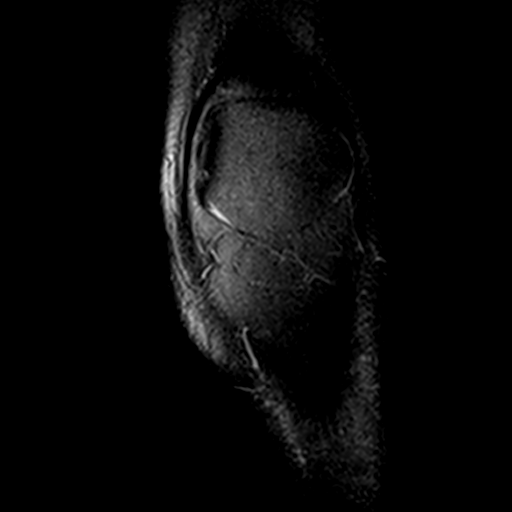

[21 of 40 positions shown; findings below may reference images not displayed]

FINDINGS: MENISCI

Medial meniscus: Peripheral longitudinal tear of the body and
posterior horn.

Lateral meniscus:  Intact.

LIGAMENTS

Cruciates:  Intact ACL and PCL.

Collaterals: Medial collateral ligament is intact. Lateral
collateral ligament complex is intact.

CARTILAGE

Patellofemoral:  No chondral defect.

Medial:  Mild superficial irregularity without focal defect.

Lateral:  No chondral defect.

Joint: Trace joint effusion. Normal Hoffa's fat. No plical
thickening.

Popliteal Fossa:  No Baker cyst. Intact popliteus tendon.

Extensor Mechanism: Intact quadriceps tendon and patellar tendon.
Intact medial and lateral patellar retinaculum. Intact MPFL.

Bones: No focal marrow signal abnormality. No fracture or
dislocation.

Other: None.
IMPRESSION: 1. Peripheral longitudinal tear of the medial meniscus body and
posterior horn.

## 2019-02-05 MED FILL — SILDENAFIL CITRATE 100 MG T: 100 | 30 days supply | Qty: 6 | Fill #4

## 2019-02-11 ENCOUNTER — Other Ambulatory Visit: Payer: Self-pay

## 2019-02-11 ENCOUNTER — Other Ambulatory Visit: Payer: No Typology Code available for payment source

## 2019-02-11 DIAGNOSIS — E782 Mixed hyperlipidemia: Secondary | ICD-10-CM

## 2019-02-12 ENCOUNTER — Encounter: Payer: Self-pay | Admitting: *Deleted

## 2019-02-12 LAB — COMPREHENSIVE METABOLIC PANEL
AG Ratio: 2.1 (calc) (ref 1.0–2.5)
ALT: 38 U/L (ref 9–46)
AST: 39 U/L (ref 10–40)
Albumin: 4.6 g/dL (ref 3.6–5.1)
Alkaline phosphatase (APISO): 92 U/L (ref 36–130)
BUN: 13 mg/dL (ref 7–25)
CO2: 25 mmol/L (ref 20–32)
Calcium: 9.9 mg/dL (ref 8.6–10.3)
Chloride: 102 mmol/L (ref 98–110)
Creat: 1.11 mg/dL (ref 0.60–1.35)
Globulin: 2.2 g/dL (calc) (ref 1.9–3.7)
Glucose, Bld: 106 mg/dL — ABNORMAL HIGH (ref 65–99)
Potassium: 4.6 mmol/L (ref 3.5–5.3)
Sodium: 136 mmol/L (ref 135–146)
Total Bilirubin: 0.5 mg/dL (ref 0.2–1.2)
Total Protein: 6.8 g/dL (ref 6.1–8.1)

## 2019-02-12 LAB — LIPID PANEL
Cholesterol: 174 mg/dL (ref <200)
HDL: 60 mg/dL (ref >=40)
LDL Cholesterol (Calc): 100 mg/dL (calc) — ABNORMAL HIGH
Non-HDL Cholesterol (Calc): 114 mg/dL (calc) (ref <130)
Total CHOL/HDL Ratio: 2.9 (calc) (ref <5.0)
Triglycerides: 62 mg/dL (ref <150)

## 2019-02-13 MED FILL — MELOXICAM 15 MG TABLET: 15 | 30 days supply | Qty: 30 | Fill #1

## 2019-05-15 MED FILL — SILDENAFIL CITRATE 100 MG T: 100 | 30 days supply | Qty: 6 | Fill #5

## 2019-06-22 ENCOUNTER — Other Ambulatory Visit: Payer: Self-pay | Admitting: Family Medicine

## 2019-06-22 DIAGNOSIS — N529 Male erectile dysfunction, unspecified: Secondary | ICD-10-CM

## 2019-06-24 ENCOUNTER — Other Ambulatory Visit: Payer: Self-pay | Admitting: Family Medicine

## 2019-06-24 DIAGNOSIS — N529 Male erectile dysfunction, unspecified: Secondary | ICD-10-CM

## 2019-06-25 ENCOUNTER — Other Ambulatory Visit: Payer: Self-pay | Admitting: Family Medicine

## 2019-06-25 MED FILL — SILDENAFIL CITRATE 100 MG T: 100 | 30 days supply | Qty: 6 | Fill #0

## 2019-08-13 MED FILL — SILDENAFIL CITRATE 100 MG T: 100 | 30 days supply | Qty: 6 | Fill #1

## 2019-09-07 ENCOUNTER — Other Ambulatory Visit: Payer: Self-pay

## 2019-09-07 ENCOUNTER — Ambulatory Visit (INDEPENDENT_AMBULATORY_CARE_PROVIDER_SITE_OTHER): Payer: No Typology Code available for payment source | Admitting: Family Medicine

## 2019-09-07 VITALS — BP 118/90 | HR 87 | Temp 97.3°F | Ht 70.0 in | Wt 174.0 lb

## 2019-09-07 DIAGNOSIS — Z114 Encounter for screening for human immunodeficiency virus [HIV]: Secondary | ICD-10-CM | POA: Diagnosis not present

## 2019-09-07 DIAGNOSIS — Z0000 Encounter for general adult medical examination without abnormal findings: Secondary | ICD-10-CM

## 2019-09-07 DIAGNOSIS — Z1159 Encounter for screening for other viral diseases: Secondary | ICD-10-CM

## 2019-09-07 NOTE — Progress Notes (Signed)
Acute Office Visit  Subjective:    Patient ID: Patrick Ramsey, male    DOB: 1970/09/10, 49 y.o.   MRN: [id number]  Chief Complaint  Patient presents with  . Cpe    HPI  Patient is a very pleasant 49 year old Caucasian male here today for complete physical exam.  He denies any medical concerns.  He does smoke 1 pack of cigarettes per day.  He denies any family history of prostate cancer or colon cancer.  He denies any lower urinary tract symptoms.  He denies any melena or hematochezia.  Therefore he is not yet due for a colonoscopy or a prostate cancer screening test.  We discussed HIV and hepatitis C screening test and he is fine if I check that today.  Otherwise he is doing well with no concerns.  He admits that he needs to quit smoking and both he and his fiance are planning to work together to do this.  We discussed Chantix and Wellbutrin along with nicotine replacement at the present time he declines a prescription for either Chantix or Wellbutrin.  Immunizations are up-to-date. Immunization History  Administered Date(s) Administered  . Influenza Inj Mdck Quad Pf 11/19/2017, 11/28/2018  . Influenza,inj,Quad PF,6+ Mos 11/22/2016, 11/19/2017  . Influenza-Unspecified 11/18/2015, 11/22/2016  . Janssen (J&J) SARS-COV-2 Vaccination 05/07/2019  . Tdap 06/03/2014    No past medical history on file.  Past Surgical History:  Procedure Laterality Date  . KNEE ARTHROSCOPY WITH MEDIAL MENISECTOMY Left 10/03/2017   Procedure: KNEE ARTHROSCOPY WITH MEDIAL MENISECTOMY DIAGNOSTIC;  Surgeon: Vickki Hearing, MD;  Location: [facility];  Service: Orthopedics;  Laterality: Left;  . KNEE ARTHROSCOPY WITH MENISCAL REPAIR Left 10/10/2017   Procedure: KNEE ARTHROSCOPY WITH MEDIAL MENISCAL REPAIR;  Surgeon: Vickki Hearing, MD;  Location: [facility];  Service: Orthopedics;  Laterality: Left;    Family History  Problem Relation Age of Onset  . Uterine cancer Mother   . Hypertension Father     Social  History   Socioeconomic History  . Marital status: Married    Spouse name: Not on file  . Number of children: Not on file  . Years of education: Not on file  . Highest education level: Not on file  Occupational History  . Not on file  Tobacco Use  . Smoking status: Current Every Day Smoker    Packs/day: 1.00    Years: 27.00    Pack years: 27.00    Types: Cigarettes    Start date: 10/17/1986  . Smokeless tobacco: Never Used  Vaping Use  . Vaping Use: Never used  Substance and Sexual Activity  . Alcohol use: Yes    Alcohol/week: 12.0 standard drinks    Types: 12 Cans of beer per week    Comment: 12 cans on weekend  . Drug use: No  . Sexual activity: Yes  Other Topics Concern  . Not on file  Social History Narrative  . Not on file   Social Determinants of Health   Financial Resource Strain:   . Difficulty of Paying Living Expenses:   Food Insecurity:   . Worried About Programme researcher, broadcasting/film/video in the Last Year:   . Barista in the Last Year:   Transportation Needs:   . Freight forwarder (Medical):   Marland Kitchen Lack of Transportation (Non-Medical):   Physical Activity:   . Days of Exercise per Week:   . Minutes of Exercise per Session:   Stress:   . Feeling of  Stress :   Social Connections:   . Frequency of Communication with Friends and Family:   . Frequency of Social Gatherings with Friends and Family:   . Attends Religious Services:   . Active Member of Clubs or Organizations:   . Attends Banker Meetings:   Marland Kitchen Marital Status:   Intimate Partner Violence:   . Fear of Current or Ex-Partner:   . Emotionally Abused:   Marland Kitchen Physically Abused:   . Sexually Abused:     Outpatient Medications Prior to Visit  Medication Sig Dispense Refill  . atorvastatin (LIPITOR) 10 MG tablet Take 1 tablet (10 mg total) by mouth at bedtime. 30 tablet 2  . ibuprofen (ADVIL) 200 MG tablet Take 400 mg by mouth every 6 (six) hours as needed.    . mometasone (NASONEX) 50  MCG/ACT nasal spray Place 2 sprays into the nose daily. 17 g 12  . sildenafil (VIAGRA) 100 MG tablet TAKE 1 TABLET (100 MG TOTAL) BY MOUTH AS NEEDED FOR ERECTILE DYSFUNCTION. 6 tablet 5  . meloxicam (MOBIC) 15 MG tablet TAKE 1 TABLET (15 MG TOTAL) BY MOUTH DAILY. (Patient not taking: Reported on 09/07/2019) 30 tablet 1   No facility-administered medications prior to visit.    No Known Allergies  Review of Systems     Objective:    Physical Exam Vitals reviewed.  Constitutional:      General: He is not in acute distress.    Appearance: Normal appearance. He is normal weight. He is not ill-appearing, toxic-appearing or diaphoretic.  HENT:     Head: Normocephalic and atraumatic.     Right Ear: Tympanic membrane, ear canal and external ear normal. There is no impacted cerumen.     Left Ear: Tympanic membrane, ear canal and external ear normal. There is no impacted cerumen.     Nose: Nose normal. No congestion or rhinorrhea.     Mouth/Throat:     Mouth: Mucous membranes are moist.     Pharynx: No oropharyngeal exudate or posterior oropharyngeal erythema.  Eyes:     Extraocular Movements: Extraocular movements intact.     Conjunctiva/sclera: Conjunctivae normal.     Pupils: Pupils are equal, round, and reactive to light.  Cardiovascular:     Rate and Rhythm: Normal rate and regular rhythm.     Pulses: Normal pulses.     Heart sounds: Normal heart sounds. No murmur heard.  No friction rub. No gallop.   Pulmonary:     Effort: Pulmonary effort is normal. No respiratory distress.     Breath sounds: Normal breath sounds. No stridor. No wheezing, rhonchi or rales.  Chest:     Chest wall: No tenderness.  Abdominal:     General: Bowel sounds are normal. There is no distension.     Palpations: Abdomen is soft.     Tenderness: There is no abdominal tenderness. There is no guarding or rebound.  Musculoskeletal:        General: Normal range of motion.     Cervical back: Normal range of  motion and neck supple. No rigidity.     Right lower leg: No edema.     Left lower leg: No edema.  Lymphadenopathy:     Cervical: No cervical adenopathy.  Skin:    General: Skin is warm.     Coloration: Skin is not jaundiced or pale.     Findings: No bruising, erythema, lesion or rash.  Neurological:     General: No focal deficit present.  Mental Status: He is alert and oriented to person, place, and time.     Cranial Nerves: No cranial nerve deficit.     Sensory: No sensory deficit.     Motor: No weakness.     Coordination: Coordination normal.     Gait: Gait normal.     Deep Tendon Reflexes: Reflexes normal.  Psychiatric:        Mood and Affect: Mood normal.        Behavior: Behavior normal.        Thought Content: Thought content normal.        Judgment: Judgment normal.   Patient has dentures  BP (!) 118/90   Pulse 87   Temp (!) 97.3 F (36.3 C)   Ht 5\' 10"  (1.778 m)   Wt 174 lb (78.9 kg)   SpO2 98%   BMI 24.97 kg/m  Wt Readings from Last 3 Encounters:  09/07/19 174 lb (78.9 kg)  11/11/18 180 lb (81.6 kg)  08/12/18 173 lb (78.5 kg)    Health Maintenance Due  Topic Date Due  . Hepatitis C Screening  Never done  . HIV Screening  Never done  . INFLUENZA VACCINE  09/06/2019    There are no preventive care reminders to display for this patient.   Lab Results  Component Value Date   TSH 1.14 07/30/2016   Lab Results  Component Value Date   WBC 5.6 08/12/2018   HGB 17.8 (H) 08/12/2018   HCT 50.1 (H) 08/12/2018   MCV 90.9 08/12/2018   PLT 235 08/12/2018   Lab Results  Component Value Date   NA 136 02/11/2019   K 4.6 02/11/2019   CO2 25 02/11/2019   GLUCOSE 106 (H) 02/11/2019   BUN 13 02/11/2019   CREATININE 1.11 02/11/2019   BILITOT 0.5 02/11/2019   ALKPHOS 90 07/30/2016   AST 39 02/11/2019   ALT 38 02/11/2019   PROT 6.8 02/11/2019   ALBUMIN 4.6 07/30/2016   CALCIUM 9.9 02/11/2019   Lab Results  Component Value Date   CHOL 174 02/11/2019    Lab Results  Component Value Date   HDL 60 02/11/2019   Lab Results  Component Value Date   LDLCALC 100 (H) 02/11/2019   Lab Results  Component Value Date   TRIG 62 02/11/2019   Lab Results  Component Value Date   CHOLHDL 2.9 02/11/2019   No results found for: HGBA1C     Assessment & Plan:   Problem List Items Addressed This Visit    None    Visit Diagnoses    Encounter for hepatitis C screening test for low risk patient    -  Primary   Relevant Orders   Hepatitis C Antibody   Encounter for screening for HIV       Relevant Orders   HIV Antibody (routine testing w rflx)   General medical exam       Relevant Orders   CBC with Differential/Platelet   COMPLETE METABOLIC PANEL WITH GFR   Lipid panel   HIV Antibody (routine testing w rflx)   Hepatitis C Antibody      Regarding his physical, I will check the patient for hepatitis C and HIV.  I will check a CBC, CMP, fasting lipid panel.  Goal LDL cholesterol is less than 100.  He admits that he is not been taking atorvastatin regularly.  He frequently forgets to take it at night.  He smokes approximately 1 pack of cigarettes per day.  He is starting to begin to work towards smoking cessation however he declines wanting to use Chantix or Wellbutrin at the present time.  He would like to try nicotine replacement therapy and work with his fiance and try to quit together.  He has not yet due for a colonoscopy or prostate cancer screening.  Regular anticipatory guidance is provided No orders of the defined types were placed in this encounter.    Donita BrooksWarren T Emil Klassen, MD

## 2019-09-08 LAB — COMPLETE METABOLIC PANEL WITH GFR
AG Ratio: 1.7 (calc) (ref 1.0–2.5)
ALT: 48 U/L — ABNORMAL HIGH (ref 9–46)
AST: 42 U/L — ABNORMAL HIGH (ref 10–40)
Albumin: 4.8 g/dL (ref 3.6–5.1)
Alkaline phosphatase (APISO): 78 U/L (ref 36–130)
BUN: 13 mg/dL (ref 7–25)
CO2: 25 mmol/L (ref 20–32)
Calcium: 9.8 mg/dL (ref 8.6–10.3)
Chloride: 95 mmol/L — ABNORMAL LOW (ref 98–110)
Creat: 1.13 mg/dL (ref 0.60–1.35)
GFR, Est African American: 89 mL/min/{1.73_m2} (ref >=60)
GFR, Est Non African American: 77 mL/min/{1.73_m2} (ref >=60)
Globulin: 2.9 g/dL (calc) (ref 1.9–3.7)
Glucose, Bld: 96 mg/dL (ref 65–99)
Potassium: 4.8 mmol/L (ref 3.5–5.3)
Sodium: 130 mmol/L — ABNORMAL LOW (ref 135–146)
Total Bilirubin: 0.7 mg/dL (ref 0.2–1.2)
Total Protein: 7.7 g/dL (ref 6.1–8.1)

## 2019-09-08 LAB — LIPID PANEL
Cholesterol: 203 mg/dL — ABNORMAL HIGH (ref <200)
HDL: 74 mg/dL (ref >=40)
LDL Cholesterol (Calc): 110 mg/dL (calc) — ABNORMAL HIGH
Non-HDL Cholesterol (Calc): 129 mg/dL (calc) (ref <130)
Total CHOL/HDL Ratio: 2.7 (calc) (ref <5.0)
Triglycerides: 97 mg/dL (ref <150)

## 2019-09-08 LAB — CBC WITH DIFFERENTIAL/PLATELET
Absolute Monocytes: 510 cells/uL (ref 200–950)
Basophils Absolute: 70 cells/uL (ref 0–200)
Basophils Relative: 1.6 %
Eosinophils Absolute: 79 cells/uL (ref 15–500)
Eosinophils Relative: 1.8 %
HCT: 51.5 % — ABNORMAL HIGH (ref 38.5–50.0)
Hemoglobin: 18.5 g/dL — ABNORMAL HIGH (ref 13.2–17.1)
Lymphs Abs: 1087 cells/uL (ref 850–3900)
MCH: 32.9 pg (ref 27.0–33.0)
MCHC: 35.9 g/dL (ref 32.0–36.0)
MCV: 91.5 fL (ref 80.0–100.0)
MPV: 9.6 fL (ref 7.5–12.5)
Monocytes Relative: 11.6 %
Neutro Abs: 2653 cells/uL (ref 1500–7800)
Neutrophils Relative %: 60.3 %
Platelets: 259 10*3/uL (ref 140–400)
RBC: 5.63 10*6/uL (ref 4.20–5.80)
RDW: 12.5 % (ref 11.0–15.0)
Total Lymphocyte: 24.7 %
WBC: 4.4 10*3/uL (ref 3.8–10.8)

## 2019-09-08 LAB — HEPATITIS C ANTIBODY
Hepatitis C Ab: NONREACTIVE
SIGNAL TO CUT-OFF: 0.01 (ref <1.00)

## 2019-09-08 LAB — HIV ANTIBODY (ROUTINE TESTING W REFLEX): HIV 1&2 Ab, 4th Generation: NONREACTIVE

## 2019-10-02 MED FILL — SILDENAFIL CITRATE 100 MG T: 100 | 30 days supply | Qty: 6 | Fill #2

## 2019-11-26 MED FILL — SILDENAFIL CITRATE 100 MG T: 100 | 30 days supply | Qty: 6 | Fill #3

## 2020-01-22 ENCOUNTER — Other Ambulatory Visit (HOSPITAL_COMMUNITY): Payer: Self-pay

## 2020-01-22 MED FILL — NICOTINE 21 MG/24HR PATCH: 21 | 14 days supply | Qty: 14 | Fill #0

## 2020-01-27 MED FILL — SILDENAFIL CITRATE 100 MG T: 100 | 30 days supply | Qty: 6 | Fill #4

## 2020-04-29 DIAGNOSIS — Z20822 Contact with and (suspected) exposure to covid-19: Secondary | ICD-10-CM | POA: Diagnosis not present

## 2020-06-07 ENCOUNTER — Other Ambulatory Visit (HOSPITAL_COMMUNITY): Payer: Self-pay

## 2020-10-21 ENCOUNTER — Other Ambulatory Visit: Payer: Self-pay

## 2020-10-21 ENCOUNTER — Encounter: Payer: Self-pay | Admitting: Family Medicine

## 2020-10-21 ENCOUNTER — Ambulatory Visit (INDEPENDENT_AMBULATORY_CARE_PROVIDER_SITE_OTHER): Payer: BC Managed Care – PPO | Admitting: Family Medicine

## 2020-10-21 VITALS — BP 128/64 | HR 70 | Temp 98.2°F | Resp 14 | Ht 70.0 in | Wt 186.0 lb

## 2020-10-21 DIAGNOSIS — G8929 Other chronic pain: Secondary | ICD-10-CM | POA: Diagnosis not present

## 2020-10-21 DIAGNOSIS — M5441 Lumbago with sciatica, right side: Secondary | ICD-10-CM

## 2020-10-21 MED ORDER — TIZANIDINE HCL 4 MG PO TABS
4.0000 mg | ORAL_TABLET | Freq: Four times a day (QID) | ORAL | 0 refills | Status: DC | PRN
Start: 2020-10-21 — End: 2022-02-13

## 2020-10-21 MED ORDER — PREDNISONE 20 MG PO TABS
ORAL_TABLET | ORAL | 0 refills | Status: DC
Start: 2020-10-21 — End: 2022-02-13

## 2020-10-21 NOTE — Progress Notes (Signed)
Subjective:    Patient ID: Patrick Ramsey, male    DOB: May 03, 1970, 50 y.o.   MRN: [id number]  HPI  Patient is a 50 year old Caucasian gentleman who complains of acute onset left-sided low back pain.  This occurred 3 weeks ago.  He bent over to pick up something at work that was very heavy and he felt a sudden tearing pain in his lower back to the left of his spine roughly at the level of L4.  The pain is still located above his left gluteus.  He states it is worse first thing in the morning when he gets out of bed.  It feels tight.  It hurts to bend over.  It hurts to stand and walk throughout the day.  He denies any radiation down his left leg.  He denies any numbness or tingling in his left leg.  He also complains of chronic right-sided low back pain roughly at the level of L4 that radiates into his posterior right hip.  This is a chronic burning pain.  This too has gotten worse over the last 3 weeks.  He denies any bowel or bladder incontinence.  He denies any leg weakness. History reviewed. No pertinent past medical history. Past Surgical History:  Procedure Laterality Date   KNEE ARTHROSCOPY WITH MEDIAL MENISECTOMY Left 10/03/2017   Procedure: KNEE ARTHROSCOPY WITH MEDIAL MENISECTOMY DIAGNOSTIC;  Surgeon: Vickki Hearing, MD;  Location: [facility];  Service: Orthopedics;  Laterality: Left;   KNEE ARTHROSCOPY WITH MENISCAL REPAIR Left 10/10/2017   Procedure: KNEE ARTHROSCOPY WITH MEDIAL MENISCAL REPAIR;  Surgeon: Vickki Hearing, MD;  Location: [facility];  Service: Orthopedics;  Laterality: Left;   Current Outpatient Medications on File Prior to Visit  Medication Sig Dispense Refill   ibuprofen (ADVIL) 200 MG tablet Take 400 mg by mouth every 6 (six) hours as needed.     meloxicam (MOBIC) 15 MG tablet TAKE 1 TABLET (15 MG TOTAL) BY MOUTH DAILY. 30 tablet 1   mometasone (NASONEX) 50 MCG/ACT nasal spray Place 2 sprays into the nose daily. (Patient not taking: Reported on 10/21/2020) 17 g 12    sildenafil (VIAGRA) 100 MG tablet TAKE 1 TABLET (100 MG TOTAL) BY MOUTH AS NEEDED FOR ERECTILE DYSFUNCTION. (Patient not taking: Reported on 10/21/2020) 6 tablet 5   No current facility-administered medications on file prior to visit.   No Known Allergies Social History   Socioeconomic History   Marital status: Married    Spouse name: Not on file   Number of children: Not on file   Years of education: Not on file   Highest education level: Not on file  Occupational History   Not on file  Tobacco Use   Smoking status: Every Day    Packs/day: 1.00    Years: 27.00    Pack years: 27.00    Types: Cigarettes    Start date: 10/17/1986   Smokeless tobacco: Never  Vaping Use   Vaping Use: Never used  Substance and Sexual Activity   Alcohol use: Yes    Alcohol/week: 12.0 standard drinks    Types: 12 Cans of beer per week    Comment: 12 cans on weekend   Drug use: No   Sexual activity: Yes  Other Topics Concern   Not on file  Social History Narrative   Not on file   Social Determinants of Health   Financial Resource Strain: Not on file  Food Insecurity: Not on file  Transportation Needs: Not on  file  Physical Activity: Not on file  Stress: Not on file  Social Connections: Not on file  Intimate Partner Violence: Not on file     Review of Systems  All other systems reviewed and are negative.     Objective:   Physical Exam Vitals reviewed.  Constitutional:      Appearance: Normal appearance.  Cardiovascular:     Rate and Rhythm: Normal rate and regular rhythm.     Heart sounds: Normal heart sounds.  Pulmonary:     Effort: Pulmonary effort is normal.     Breath sounds: Normal breath sounds.  Musculoskeletal:     Lumbar back: Spasms and tenderness present. No swelling, deformity or bony tenderness. Decreased range of motion. Negative right straight leg raise test and negative left straight leg raise test. No scoliosis.  Neurological:     Mental Status: He is alert.           Assessment & Plan:  Chronic right-sided low back pain with right-sided sciatica - Plan: DG Lumbar Spine Complete I believe the patient likely pulled a muscle in his lower back 3 weeks ago.  This I anticipate will heal gradually on its own.  I recommended a prednisone taper pack since he is tried and failed meloxicam.  He can also use tizanidine 4 mg every 6 hours as needed for muscle spasms.  I believe that the pain in his right posterior hip is likely right-sided sciatica due to a bulging disc in his back.  I recommended an x-ray of the lumbar spine to evaluate further

## 2021-12-22 DIAGNOSIS — J09X2 Influenza due to identified novel influenza A virus with other respiratory manifestations: Secondary | ICD-10-CM | POA: Diagnosis not present

## 2022-02-13 ENCOUNTER — Ambulatory Visit (INDEPENDENT_AMBULATORY_CARE_PROVIDER_SITE_OTHER): Payer: BC Managed Care – PPO | Admitting: Family Medicine

## 2022-02-13 ENCOUNTER — Other Ambulatory Visit (HOSPITAL_COMMUNITY): Payer: Self-pay

## 2022-02-13 ENCOUNTER — Encounter: Payer: Self-pay | Admitting: Family Medicine

## 2022-02-13 VITALS — BP 162/120 | HR 93 | Ht 70.0 in | Wt 184.0 lb

## 2022-02-13 DIAGNOSIS — Z0000 Encounter for general adult medical examination without abnormal findings: Secondary | ICD-10-CM

## 2022-02-13 DIAGNOSIS — Z0001 Encounter for general adult medical examination with abnormal findings: Secondary | ICD-10-CM

## 2022-02-13 DIAGNOSIS — Z1211 Encounter for screening for malignant neoplasm of colon: Secondary | ICD-10-CM | POA: Diagnosis not present

## 2022-02-13 DIAGNOSIS — D751 Secondary polycythemia: Secondary | ICD-10-CM | POA: Diagnosis not present

## 2022-02-13 DIAGNOSIS — F17200 Nicotine dependence, unspecified, uncomplicated: Secondary | ICD-10-CM

## 2022-02-13 MED ORDER — VALSARTAN 160 MG PO TABS
160.0000 mg | ORAL_TABLET | Freq: Every day | ORAL | 3 refills | Status: DC
  Filled 2022-02-13: qty 90, 90d supply, fill #0

## 2022-02-13 NOTE — Progress Notes (Deleted)
Acute Office Visit  Subjective:    Patient ID: Patrick Ramsey, male    DOB: [birth year], 52 y.o.   MRN: [id number]    HPI  Patient is a very pleasant 52 year old Caucasian male here today for complete physical exam.     No past medical history on file.  Past Surgical History:  Procedure Laterality Date   KNEE ARTHROSCOPY WITH MEDIAL MENISECTOMY Left 10/03/2017   Procedure: KNEE ARTHROSCOPY WITH MEDIAL MENISECTOMY DIAGNOSTIC;  Surgeon: Vickki Hearing, MD;  Location: [facility];  Service: Orthopedics;  Laterality: Left;   KNEE ARTHROSCOPY WITH MENISCAL REPAIR Left 10/10/2017   Procedure: KNEE ARTHROSCOPY WITH MEDIAL MENISCAL REPAIR;  Surgeon: Vickki Hearing, MD;  Location: [facility];  Service: Orthopedics;  Laterality: Left;    Family History  Problem Relation Age of Onset   Uterine cancer Mother    Hypertension Father     Social History   Socioeconomic History   Marital status: Married    Spouse name: Not on file   Number of children: Not on file   Years of education: Not on file   Highest education level: Not on file  Occupational History   Not on file  Tobacco Use   Smoking status: Every Day    Packs/day: 1.00    Years: 27.00    Total pack years: 27.00    Types: Cigarettes    Start date: 10/17/1986   Smokeless tobacco: Never  Vaping Use   Vaping Use: Never used  Substance and Sexual Activity   Alcohol use: Yes    Alcohol/week: 12.0 standard drinks of alcohol    Types: 12 Cans of beer per week    Comment: 12 cans on weekend   Drug use: No   Sexual activity: Yes  Other Topics Concern   Not on file  Social History Narrative   Not on file   Social Determinants of Health   Financial Resource Strain: Not on file  Food Insecurity: Not on file  Transportation Needs: Not on file  Physical Activity: Not on file  Stress: Not on file  Social Connections: Not on file  Intimate Partner Violence: Not on file    Outpatient Medications Prior to Visit  Medication  Sig Dispense Refill   ibuprofen (ADVIL) 200 MG tablet Take 400 mg by mouth every 6 (six) hours as needed.     meloxicam (MOBIC) 15 MG tablet TAKE 1 TABLET (15 MG TOTAL) BY MOUTH DAILY. 30 tablet 1   mometasone (NASONEX) 50 MCG/ACT nasal spray Place 2 sprays into the nose daily. (Patient not taking: Reported on 10/21/2020) 17 g 12   predniSONE (DELTASONE) 20 MG tablet 3 tabs poqday 1-2, 2 tabs poqday 3-4, 1 tab poqday 5-6 12 tablet 0   sildenafil (VIAGRA) 100 MG tablet TAKE 1 TABLET (100 MG TOTAL) BY MOUTH AS NEEDED FOR ERECTILE DYSFUNCTION. (Patient not taking: Reported on 10/21/2020) 6 tablet 5   tiZANidine (ZANAFLEX) 4 MG tablet Take 1 tablet (4 mg total) by mouth every 6 (six) hours as needed for muscle spasms. 30 tablet 0   No facility-administered medications prior to visit.    No Known Allergies  Review of Systems     Objective:    Physical Exam Vitals reviewed.  Constitutional:      General: He is not in acute distress.    Appearance: Normal appearance. He is normal weight. He is not ill-appearing, toxic-appearing or diaphoretic.  HENT:     Head: Normocephalic and atraumatic.  Right Ear: Tympanic membrane, ear canal and external ear normal. There is no impacted cerumen.     Left Ear: Tympanic membrane, ear canal and external ear normal. There is no impacted cerumen.     Nose: Nose normal. No congestion or rhinorrhea.     Mouth/Throat:     Mouth: Mucous membranes are moist.     Pharynx: No oropharyngeal exudate or posterior oropharyngeal erythema.  Eyes:     Extraocular Movements: Extraocular movements intact.     Conjunctiva/sclera: Conjunctivae normal.     Pupils: Pupils are equal, round, and reactive to light.  Cardiovascular:     Rate and Rhythm: Normal rate and regular rhythm.     Pulses: Normal pulses.     Heart sounds: Normal heart sounds. No murmur heard.    No friction rub. No gallop.  Pulmonary:     Effort: Pulmonary effort is normal. No respiratory distress.      Breath sounds: Normal breath sounds. No stridor. No wheezing, rhonchi or rales.  Chest:     Chest wall: No tenderness.  Abdominal:     General: Bowel sounds are normal. There is no distension.     Palpations: Abdomen is soft.     Tenderness: There is no abdominal tenderness. There is no guarding or rebound.  Musculoskeletal:        General: Normal range of motion.     Cervical back: Normal range of motion and neck supple. No rigidity.     Right lower leg: No edema.     Left lower leg: No edema.  Lymphadenopathy:     Cervical: No cervical adenopathy.  Skin:    General: Skin is warm.     Coloration: Skin is not jaundiced or pale.     Findings: No bruising, erythema, lesion or rash.  Neurological:     General: No focal deficit present.     Mental Status: He is alert and oriented to person, place, and time.     Cranial Nerves: No cranial nerve deficit.     Sensory: No sensory deficit.     Motor: No weakness.     Coordination: Coordination normal.     Gait: Gait normal.     Deep Tendon Reflexes: Reflexes normal.  Psychiatric:        Mood and Affect: Mood normal.        Behavior: Behavior normal.        Thought Content: Thought content normal.        Judgment: Judgment normal.        Assessment & Plan:      Susy Frizzle, MD

## 2022-02-13 NOTE — Progress Notes (Signed)
Subjective:    Patient ID: Patrick Ramsey, male    DOB: September 09, 1970, 52 y.o.   MRN: [id number]  Sinus Problem  Patient is a very pleasant 52 year old Caucasian gentleman here today for complete physical exam.  He is recently had a head cold with sinus drainage but is otherwise doing well.  His blood pressure is extremely high.  I rechecked and verify that it is 162/110.  He denies any chest pain or shortness of breath or dyspnea on exertion.  He does smoke.  He has a history of polysubstance I feel is secondary polycythemia.  His last hemoglobin was 18.5.  He denies any strokelike symptoms.  He denies any headaches.  He is due for colon cancer screening.  He had his flu shot. Past Medical History:  Diagnosis Date   Hypertension    Smoker    Past Surgical History:  Procedure Laterality Date   KNEE ARTHROSCOPY WITH MEDIAL MENISECTOMY Left 10/03/2017   Procedure: KNEE ARTHROSCOPY WITH MEDIAL MENISECTOMY DIAGNOSTIC;  Surgeon: Carole Civil, MD;  Location: [facility];  Service: Orthopedics;  Laterality: Left;   KNEE ARTHROSCOPY WITH MENISCAL REPAIR Left 10/10/2017   Procedure: KNEE ARTHROSCOPY WITH MEDIAL MENISCAL REPAIR;  Surgeon: Carole Civil, MD;  Location: [facility];  Service: Orthopedics;  Laterality: Left;   Current Outpatient Medications on File Prior to Visit  Medication Sig Dispense Refill   mometasone (NASONEX) 50 MCG/ACT nasal spray Place 2 sprays into the nose daily. 17 g 12   No current facility-administered medications on file prior to visit.   No Known Allergies Social History   Socioeconomic History   Marital status: Married    Spouse name: Not on file   Number of children: Not on file   Years of education: Not on file   Highest education level: Not on file  Occupational History   Not on file  Tobacco Use   Smoking status: Every Day    Packs/day: 1.00    Years: 27.00    Total pack years: 27.00    Types: Cigarettes    Start date: 10/17/1986   Smokeless tobacco:  Never  Vaping Use   Vaping Use: Never used  Substance and Sexual Activity   Alcohol use: Yes    Alcohol/week: 12.0 standard drinks of alcohol    Types: 12 Cans of beer per week    Comment: 12 cans on weekend   Drug use: No   Sexual activity: Yes  Other Topics Concern   Not on file  Social History Narrative   Not on file   Social Determinants of Health   Financial Resource Strain: Not on file  Food Insecurity: Not on file  Transportation Needs: Not on file  Physical Activity: Not on file  Stress: Not on file  Social Connections: Not on file  Intimate Partner Violence: Not on file     Review of Systems  All other systems reviewed and are negative.      Objective:   Physical Exam Vitals reviewed.  Constitutional:      General: He is not in acute distress.    Appearance: Normal appearance. He is normal weight. He is not ill-appearing, toxic-appearing or diaphoretic.  HENT:     Head: Normocephalic and atraumatic.     Right Ear: Tympanic membrane and ear canal normal.     Left Ear: Tympanic membrane and ear canal normal.     Nose: Congestion and rhinorrhea present.     Mouth/Throat:  Mouth: Mucous membranes are moist.     Pharynx: Oropharynx is clear. No oropharyngeal exudate or posterior oropharyngeal erythema.  Neck:     Vascular: No carotid bruit.  Cardiovascular:     Rate and Rhythm: Normal rate and regular rhythm.     Heart sounds: Normal heart sounds. No murmur heard.    No friction rub. No gallop.  Pulmonary:     Effort: Pulmonary effort is normal.     Breath sounds: Wheezing present. No rhonchi or rales.  Abdominal:     General: Abdomen is flat. Bowel sounds are normal. There is no distension.     Palpations: Abdomen is soft. There is no mass.  Musculoskeletal:     Cervical back: Normal range of motion and neck supple.     Right lower leg: No edema.     Left lower leg: No edema.  Lymphadenopathy:     Cervical: No cervical adenopathy.  Skin:     Coloration: Skin is not jaundiced.     Findings: No bruising, erythema, lesion or rash.  Neurological:     General: No focal deficit present.     Mental Status: He is alert and oriented to person, place, and time. Mental status is at baseline.     Cranial Nerves: No cranial nerve deficit.     Sensory: No sensory deficit.     Motor: No weakness.     Gait: Gait normal.           Assessment & Plan:  General medical exam - Plan: CBC with Differential/Platelet, COMPLETE METABOLIC PANEL WITH GFR, Lipid panel, CBC with Differential/Platelet, COMPLETE METABOLIC PANEL WITH GFR, Lipid panel  Polycythemia  Smoker  Colon cancer screening - Plan: Cologuard Patient's blood pressure is elevated.  I will start him on valsartan 160 mg daily and recheck blood pressure in 2 weeks.  Encourage smoking cessation.  We discussed Chantix however the patient would like to try to quit cold [place] with his wife.  He is already set a quit date.  I am concerned about his polycythemia so we will repeat a CBC today along with CMP and lipid panel.  Patient is already had his flu shot.  Schedule colon cancer screening by checking Cologuard.  Not yet due for PSA.  Recommended a COVID booster

## 2022-02-15 ENCOUNTER — Other Ambulatory Visit: Payer: Self-pay

## 2022-02-15 DIAGNOSIS — R748 Abnormal levels of other serum enzymes: Secondary | ICD-10-CM

## 2022-02-16 LAB — CBC WITH DIFFERENTIAL/PLATELET
Absolute Monocytes: 679 cells/uL (ref 200–950)
Basophils Absolute: 52 cells/uL (ref 0–200)
Basophils Relative: 1.2 %
Eosinophils Absolute: 99 cells/uL (ref 15–500)
Eosinophils Relative: 2.3 %
HCT: 49.4 % (ref 38.5–50.0)
Hemoglobin: 17.6 g/dL — ABNORMAL HIGH (ref 13.2–17.1)
Lymphs Abs: 980 cells/uL (ref 850–3900)
MCH: 33.1 pg — ABNORMAL HIGH (ref 27.0–33.0)
MCHC: 35.6 g/dL (ref 32.0–36.0)
MCV: 92.9 fL (ref 80.0–100.0)
MPV: 9.4 fL (ref 7.5–12.5)
Monocytes Relative: 15.8 %
Neutro Abs: 2490 cells/uL (ref 1500–7800)
Neutrophils Relative %: 57.9 %
Platelets: 226 10*3/uL (ref 140–400)
RBC: 5.32 10*6/uL (ref 4.20–5.80)
RDW: 12.5 % (ref 11.0–15.0)
Total Lymphocyte: 22.8 %
WBC: 4.3 10*3/uL (ref 3.8–10.8)

## 2022-02-16 LAB — COMPLETE METABOLIC PANEL WITH GFR
AG Ratio: 1.8 (calc) (ref 1.0–2.5)
ALT: 92 U/L — ABNORMAL HIGH (ref 9–46)
AST: 55 U/L — ABNORMAL HIGH (ref 10–40)
Albumin: 4.8 g/dL (ref 3.6–5.1)
Alkaline phosphatase (APISO): 90 U/L (ref 36–130)
BUN: 15 mg/dL (ref 7–25)
CO2: 26 mmol/L (ref 20–32)
Calcium: 9.7 mg/dL (ref 8.6–10.3)
Chloride: 99 mmol/L (ref 98–110)
Creat: 1.02 mg/dL (ref 0.60–1.29)
Globulin: 2.6 g/dL (calc) (ref 1.9–3.7)
Glucose, Bld: 110 mg/dL — ABNORMAL HIGH (ref 65–99)
Potassium: 4.3 mmol/L (ref 3.5–5.3)
Sodium: 134 mmol/L — ABNORMAL LOW (ref 135–146)
Total Bilirubin: 0.6 mg/dL (ref 0.2–1.2)
Total Protein: 7.4 g/dL (ref 6.1–8.1)
eGFR: 90 mL/min/{1.73_m2} (ref >=60)

## 2022-02-16 LAB — HEPATITIS PANEL, ACUTE
Hep A IgM: NONREACTIVE
Hep B C IgM: NONREACTIVE
Hepatitis B Surface Ag: NONREACTIVE
Hepatitis C Ab: NONREACTIVE

## 2022-02-16 LAB — LIPID PANEL
Cholesterol: 222 mg/dL — ABNORMAL HIGH (ref <200)
HDL: 74 mg/dL (ref >=40)
LDL Cholesterol (Calc): 120 mg/dL (calc) — ABNORMAL HIGH
Non-HDL Cholesterol (Calc): 148 mg/dL (calc) — ABNORMAL HIGH (ref <130)
Total CHOL/HDL Ratio: 3 (calc) (ref <5.0)
Triglycerides: 160 mg/dL — ABNORMAL HIGH (ref <150)

## 2022-02-16 LAB — TEST AUTHORIZATION

## 2022-02-27 ENCOUNTER — Telehealth: Payer: Self-pay

## 2022-02-27 NOTE — Telephone Encounter (Signed)
Pt called in stating that he was recently seen in office about his bp. Pt states that he has been taking a med prescribed by pcp to see if it would help with his bp. Pt states that he has been keeping track of his bp readings and feels that the med not be working well for him. Pt would like to know if there is another med that pcp would like for him to try. Please advise  Cb#: [phone]  BP readings:  1/14   179/1173 1/15   200/120 1/16   175/109 1/17   187/115 1/18   194/119 1/22  190/117 1/23   175/109

## 2022-03-06 ENCOUNTER — Ambulatory Visit
Admission: RE | Admit: 2022-03-06 | Discharge: 2022-03-06 | Disposition: A | Discharge status: Home / Self Care | Payer: BC Managed Care – PPO | Source: Ambulatory Visit | Attending: Family Medicine | Admitting: Family Medicine

## 2022-03-06 DIAGNOSIS — R748 Abnormal levels of other serum enzymes: Secondary | ICD-10-CM

## 2022-03-06 DIAGNOSIS — K824 Cholesterolosis of gallbladder: Secondary | ICD-10-CM | POA: Diagnosis not present

## 2022-03-14 ENCOUNTER — Encounter: Payer: Self-pay | Admitting: Family Medicine

## 2022-03-15 ENCOUNTER — Other Ambulatory Visit (HOSPITAL_COMMUNITY): Payer: Self-pay

## 2022-03-15 ENCOUNTER — Other Ambulatory Visit: Payer: Self-pay

## 2022-03-15 DIAGNOSIS — I10 Essential (primary) hypertension: Secondary | ICD-10-CM

## 2022-03-15 MED ORDER — HYDROCHLOROTHIAZIDE 25 MG PO TABS
25.0000 mg | ORAL_TABLET | Freq: Every day | ORAL | 3 refills | Status: DC
  Filled 2022-03-15: qty 90, 90d supply, fill #0

## 2022-04-06 ENCOUNTER — Other Ambulatory Visit (HOSPITAL_COMMUNITY): Payer: Self-pay

## 2022-04-06 ENCOUNTER — Other Ambulatory Visit: Payer: Self-pay

## 2022-04-06 DIAGNOSIS — I10 Essential (primary) hypertension: Secondary | ICD-10-CM

## 2022-04-06 DIAGNOSIS — E782 Mixed hyperlipidemia: Secondary | ICD-10-CM

## 2022-04-06 MED ORDER — VALSARTAN 320 MG PO TABS
320.0000 mg | ORAL_TABLET | Freq: Every day | ORAL | 3 refills | Status: DC
  Filled 2022-04-06: qty 90, 90d supply, fill #0
  Filled 2022-07-17: qty 90, 90d supply, fill #1
  Filled 2022-10-17: qty 90, 90d supply, fill #2
  Filled 2023-01-21: qty 90, 90d supply, fill #3

## 2022-05-17 DIAGNOSIS — Z5329 Procedure and treatment not carried out because of patient's decision for other reasons: Secondary | ICD-10-CM | POA: Diagnosis not present

## 2022-05-17 DIAGNOSIS — Z8249 Family history of ischemic heart disease and other diseases of the circulatory system: Secondary | ICD-10-CM | POA: Diagnosis not present

## 2022-05-17 DIAGNOSIS — E878 Other disorders of electrolyte and fluid balance, not elsewhere classified: Secondary | ICD-10-CM | POA: Diagnosis not present

## 2022-05-17 DIAGNOSIS — F17210 Nicotine dependence, cigarettes, uncomplicated: Secondary | ICD-10-CM | POA: Diagnosis not present

## 2022-05-17 DIAGNOSIS — R9431 Abnormal electrocardiogram [ECG] [EKG]: Secondary | ICD-10-CM | POA: Diagnosis not present

## 2022-05-17 DIAGNOSIS — R079 Chest pain, unspecified: Secondary | ICD-10-CM | POA: Diagnosis not present

## 2022-05-17 DIAGNOSIS — R55 Syncope and collapse: Secondary | ICD-10-CM | POA: Diagnosis not present

## 2022-05-17 DIAGNOSIS — Y906 Blood alcohol level of 120-199 mg/100 ml: Secondary | ICD-10-CM | POA: Diagnosis not present

## 2022-05-17 DIAGNOSIS — I10 Essential (primary) hypertension: Secondary | ICD-10-CM | POA: Diagnosis not present

## 2022-05-17 DIAGNOSIS — I498 Other specified cardiac arrhythmias: Secondary | ICD-10-CM | POA: Diagnosis not present

## 2022-05-17 DIAGNOSIS — F10929 Alcohol use, unspecified with intoxication, unspecified: Secondary | ICD-10-CM | POA: Diagnosis not present

## 2022-05-17 DIAGNOSIS — Z79899 Other long term (current) drug therapy: Secondary | ICD-10-CM | POA: Diagnosis not present

## 2022-05-17 DIAGNOSIS — E871 Hypo-osmolality and hyponatremia: Secondary | ICD-10-CM | POA: Diagnosis not present

## 2022-05-18 DIAGNOSIS — R55 Syncope and collapse: Secondary | ICD-10-CM | POA: Diagnosis not present

## 2022-05-18 DIAGNOSIS — I498 Other specified cardiac arrhythmias: Secondary | ICD-10-CM | POA: Diagnosis not present

## 2022-05-18 DIAGNOSIS — E871 Hypo-osmolality and hyponatremia: Secondary | ICD-10-CM | POA: Diagnosis not present

## 2022-05-18 DIAGNOSIS — E878 Other disorders of electrolyte and fluid balance, not elsewhere classified: Secondary | ICD-10-CM | POA: Diagnosis not present

## 2022-05-24 ENCOUNTER — Encounter: Payer: Self-pay | Admitting: Family Medicine

## 2022-05-24 ENCOUNTER — Ambulatory Visit: Payer: BC Managed Care – PPO | Admitting: Family Medicine

## 2022-05-24 VITALS — BP 142/86 | HR 82 | Temp 98.5°F | Ht 70.0 in | Wt 184.4 lb

## 2022-05-24 DIAGNOSIS — E222 Syndrome of inappropriate secretion of antidiuretic hormone: Secondary | ICD-10-CM | POA: Diagnosis not present

## 2022-05-24 NOTE — Progress Notes (Signed)
Subjective:    Patient ID: Patrick Ramsey, male    DOB: [birth year], 52 y.o.   MRN: [id number]  Patient is a very pleasant 52 year old Caucasian gentleman with a history of hypertension.  I have placed the patient on valsartan and hydrochlorothiazide for severe hypertension.  He has been on hydrochlorothiazide for approximately 2 months with no side effects that he was aware of.  His sodium when I last saw the patient was 134.  He recently went to [place].  He admits that he was drinking lots of fluids while in [place] including alcohol.  While standing in line to get on the plane to come back home, he had a syncopal episode.  There was no witnessed seizure activity.  They took him to the hospital.  In the hospital they did a CT scan of the head that was negative for any hemorrhage or stroke.  They ruled the patient out for heart attack.  Echocardiogram of the heart per the patient's report showed no damage to the heart.  He was monitored on telemetry for 3 days with no irregularities.  However his sodium level was found to be 116!Marland Kitchen  Patient was taken off hydrochlorothiazide.  By the day of discharge, his sodium was 128.  He is here today for follow-up. Past Medical History:  Diagnosis Date   Hypertension    Hyponatremia    suspect SIADH   Smoker    Past Surgical History:  Procedure Laterality Date   KNEE ARTHROSCOPY WITH MEDIAL MENISECTOMY Left 10/03/2017   Procedure: KNEE ARTHROSCOPY WITH MEDIAL MENISECTOMY DIAGNOSTIC;  Surgeon: Vickki Hearing, MD;  Location: [facility];  Service: Orthopedics;  Laterality: Left;   KNEE ARTHROSCOPY WITH MENISCAL REPAIR Left 10/10/2017   Procedure: KNEE ARTHROSCOPY WITH MEDIAL MENISCAL REPAIR;  Surgeon: Vickki Hearing, MD;  Location: [facility];  Service: Orthopedics;  Laterality: Left;   Current Outpatient Medications on File Prior to Visit  Medication Sig Dispense Refill   mometasone (NASONEX) 50 MCG/ACT nasal spray Place 2 sprays into the nose daily. 17 g 12    valsartan (DIOVAN) 320 MG tablet Take 1 tablet (320 mg total) by mouth daily. 90 tablet 3   No current facility-administered medications on file prior to visit.   Allergies  Allergen Reactions   Hydrochlorothiazide     Hyponatremia    Social History   Socioeconomic History   Marital status: Married    Spouse name: Not on file   Number of children: Not on file   Years of education: Not on file   Highest education level: Not on file  Occupational History   Not on file  Tobacco Use   Smoking status: Every Day    Packs/day: 1.00    Years: 27.00    Additional pack years: 0.00    Total pack years: 27.00    Types: Cigarettes    Start date: 10/17/1986   Smokeless tobacco: Never  Vaping Use   Vaping Use: Never used  Substance and Sexual Activity   Alcohol use: Yes    Alcohol/week: 12.0 standard drinks of alcohol    Types: 12 Cans of beer per week    Comment: 12 cans on weekend   Drug use: No   Sexual activity: Yes  Other Topics Concern   Not on file  Social History Narrative   Not on file   Social Determinants of Health   Financial Resource Strain: Not on file  Food Insecurity: Not on file  Transportation Needs: Not  on file  Physical Activity: Not on file  Stress: Not on file  Social Connections: Not on file  Intimate Partner Violence: Not on file     Review of Systems  All other systems reviewed and are negative.      Objective:   Physical Exam Vitals reviewed.  Constitutional:      General: He is not in acute distress.    Appearance: Normal appearance. He is normal weight. He is not ill-appearing, toxic-appearing or diaphoretic.  HENT:     Head: Normocephalic and atraumatic.     Right Ear: Tympanic membrane and ear canal normal.     Left Ear: Tympanic membrane and ear canal normal.     Nose: Congestion and rhinorrhea present.     Mouth/Throat:     Mouth: Mucous membranes are moist.     Pharynx: Oropharynx is clear. No oropharyngeal exudate or posterior  oropharyngeal erythema.  Neck:     Vascular: No carotid bruit.  Cardiovascular:     Rate and Rhythm: Normal rate and regular rhythm.     Heart sounds: Normal heart sounds. No murmur heard.    No friction rub. No gallop.  Pulmonary:     Effort: Pulmonary effort is normal.     Breath sounds: Wheezing present. No rhonchi or rales.  Abdominal:     General: Abdomen is flat. Bowel sounds are normal. There is no distension.     Palpations: Abdomen is soft. There is no mass.  Musculoskeletal:     Cervical back: Normal range of motion and neck supple.     Right lower leg: No edema.     Left lower leg: No edema.  Lymphadenopathy:     Cervical: No cervical adenopathy.  Skin:    Coloration: Skin is not jaundiced.     Findings: No bruising, erythema, lesion or rash.  Neurological:     General: No focal deficit present.     Mental Status: He is alert and oriented to person, place, and time. Mental status is at baseline.     Cranial Nerves: No cranial nerve deficit.     Sensory: No sensory deficit.     Motor: No weakness.     Gait: Gait normal.           Assessment & Plan:  SIADH (syndrome of inappropriate ADH production) - Plan: BASIC METABOLIC PANEL WITH GFR, Sodium, urine, random, Osmolality, urine, Osmolality Patient has been eating salt to try to correct his hyponatremia.  We spent the majority of today's visit and discussion.  I suspect that he has SIADH.  I explained that the problem is too much fluid retention not too little salt in the blood vessels.  Therefore I recommended that we discontinue hydrochlorothiazide indefinitely.  This will only exacerbate hyponatremia.  I will also check a sodium level today.  If his sodium level is still low, we may need to get the patient off valsartan as well.  If necessary we will start the patient on amlodipine as this should not have any effect on his sodium.  I will check a serum osmolality, urine osmolality, and a urine sodium.  If his urine  sodium is elevated, if his urine osmolality is elevated, and that this urine osmolality is low, this would confirm SIADH.  I recommended the patient try not to drink excessive fluids.  On normal days I recommended trying to maintain a fluid balance around 1200 mL.  If he does work hard and sweat I recommended drinking  Gatorade to correct rather than free water.

## 2022-05-25 LAB — BASIC METABOLIC PANEL WITH GFR
BUN/Creatinine Ratio: 16 (calc) (ref 6–22)
BUN: 21 mg/dL (ref 7–25)
CO2: 23 mmol/L (ref 20–32)
Calcium: 9.3 mg/dL (ref 8.6–10.3)
Chloride: 98 mmol/L (ref 98–110)
Creat: 1.31 mg/dL — ABNORMAL HIGH (ref 0.70–1.30)
Glucose, Bld: 99 mg/dL (ref 65–99)
Potassium: 4 mmol/L (ref 3.5–5.3)
Sodium: 132 mmol/L — ABNORMAL LOW (ref 135–146)
eGFR: 66 mL/min/{1.73_m2} (ref >=60)

## 2022-05-25 LAB — OSMOLALITY, URINE: Osmolality, Ur: 344 mOsm/kg (ref 50–1200)

## 2022-05-25 LAB — SODIUM, URINE, RANDOM: Sodium, Ur: 37 mmol/L (ref 28–272)

## 2022-05-25 LAB — OSMOLALITY: Osmolality: 279 mOsm/kg (ref 278–305)

## 2022-05-28 ENCOUNTER — Encounter: Payer: Self-pay | Admitting: Family Medicine

## 2022-06-12 ENCOUNTER — Encounter: Payer: Self-pay | Admitting: Family Medicine

## 2022-06-14 ENCOUNTER — Other Ambulatory Visit: Payer: Self-pay | Admitting: Family Medicine

## 2022-06-14 ENCOUNTER — Other Ambulatory Visit (HOSPITAL_COMMUNITY): Payer: Self-pay

## 2022-06-14 MED ORDER — AMLODIPINE BESYLATE 10 MG PO TABS
10.0000 mg | ORAL_TABLET | Freq: Every day | ORAL | 3 refills | Status: DC
  Filled 2022-06-14: qty 90, 90d supply, fill #0
  Filled 2022-09-23: qty 90, 90d supply, fill #1
  Filled 2022-12-27: qty 90, 90d supply, fill #2
  Filled 2023-04-07: qty 90, 90d supply, fill #3

## 2022-07-30 ENCOUNTER — Encounter: Payer: Self-pay | Admitting: Family Medicine

## 2022-08-01 ENCOUNTER — Other Ambulatory Visit (HOSPITAL_COMMUNITY): Payer: Self-pay

## 2022-08-01 ENCOUNTER — Other Ambulatory Visit: Payer: Self-pay

## 2022-08-01 DIAGNOSIS — I10 Essential (primary) hypertension: Secondary | ICD-10-CM

## 2022-08-01 DIAGNOSIS — E782 Mixed hyperlipidemia: Secondary | ICD-10-CM

## 2022-08-01 MED ORDER — NEBIVOLOL HCL 5 MG PO TABS
5.0000 mg | ORAL_TABLET | Freq: Every day | ORAL | 1 refills | Status: DC
  Filled 2022-08-01: qty 30, 30d supply, fill #0

## 2022-08-03 ENCOUNTER — Other Ambulatory Visit (HOSPITAL_COMMUNITY): Payer: Self-pay

## 2022-08-23 ENCOUNTER — Encounter: Payer: Self-pay | Admitting: Family Medicine

## 2022-08-24 ENCOUNTER — Other Ambulatory Visit (HOSPITAL_COMMUNITY): Payer: Self-pay

## 2022-08-24 ENCOUNTER — Other Ambulatory Visit: Payer: Self-pay | Admitting: Family Medicine

## 2022-08-24 MED ORDER — MELOXICAM 15 MG PO TABS
15.0000 mg | ORAL_TABLET | Freq: Every day | ORAL | 0 refills | Status: DC
  Filled 2022-08-24: qty 30, 30d supply, fill #0

## 2022-08-28 ENCOUNTER — Other Ambulatory Visit (HOSPITAL_COMMUNITY): Payer: Self-pay

## 2023-03-19 ENCOUNTER — Encounter: Payer: Self-pay | Admitting: Family Medicine

## 2023-03-21 ENCOUNTER — Other Ambulatory Visit (HOSPITAL_COMMUNITY): Payer: Self-pay

## 2023-03-21 ENCOUNTER — Other Ambulatory Visit: Payer: Self-pay | Admitting: Family Medicine

## 2023-03-21 MED ORDER — SILDENAFIL CITRATE 100 MG PO TABS
100.0000 mg | ORAL_TABLET | Freq: Every day | ORAL | 11 refills | Status: DC | PRN
  Filled 2023-03-21: qty 10, 10d supply, fill #0
  Filled 2023-04-25: qty 10, 10d supply, fill #1

## 2023-03-22 ENCOUNTER — Other Ambulatory Visit (HOSPITAL_COMMUNITY): Payer: Self-pay

## 2023-04-22 ENCOUNTER — Other Ambulatory Visit: Payer: Self-pay | Admitting: Family Medicine

## 2023-04-22 DIAGNOSIS — I10 Essential (primary) hypertension: Secondary | ICD-10-CM

## 2023-04-24 ENCOUNTER — Other Ambulatory Visit (HOSPITAL_COMMUNITY): Payer: Self-pay

## 2023-04-24 MED ORDER — VALSARTAN 320 MG PO TABS
320.0000 mg | ORAL_TABLET | Freq: Every day | ORAL | 0 refills | Status: DC
  Filled 2023-04-24: qty 30, 30d supply, fill #0

## 2023-04-24 NOTE — Telephone Encounter (Signed)
 Last office visit 05/24/22 Requested Prescriptions  Pending Prescriptions Disp Refills   valsartan (DIOVAN) 320 MG tablet 90 tablet 3    Sig: Take 1 tablet (320 mg total) by mouth daily.     Cardiovascular:  Angiotensin Receptor Blockers Failed - 04/24/2023  9:22 AM      Failed - Cr in normal range and within 180 days    Creat  Date Value Ref Range Status  05/24/2022 1.31 (H) 0.70 - 1.30 mg/dL Final         Failed - K in normal range and within 180 days    Potassium  Date Value Ref Range Status  05/24/2022 4.0 3.5 - 5.3 mmol/L Final         Failed - Last BP in normal range    BP Readings from Last 1 Encounters:  05/24/22 (!) 142/86         Failed - Valid encounter within last 6 months    Recent Outpatient Visits           2 years ago Chronic right-sided low back pain with right-sided sciatica   [facility] Donita Brooks, MD   3 years ago Encounter for hepatitis C screening test for low risk patient   [facility] Donita Brooks, MD   4 years ago Left foot pain   [facility] [place], Velna Hatchet, MD   4 years ago Encounter for preventive health examination   [facility] Danelle Berry, PA-C   5 years ago Encounter for preventive health examination   [facility] Dorena Bodo, [place]              Passed - Patient is not pregnant

## 2023-04-26 ENCOUNTER — Other Ambulatory Visit (HOSPITAL_COMMUNITY): Payer: Self-pay

## 2023-06-04 ENCOUNTER — Encounter: Payer: Self-pay | Admitting: Family Medicine

## 2023-06-04 ENCOUNTER — Ambulatory Visit: Admitting: Family Medicine

## 2023-06-04 ENCOUNTER — Other Ambulatory Visit (HOSPITAL_COMMUNITY): Payer: Self-pay

## 2023-06-04 ENCOUNTER — Other Ambulatory Visit: Payer: Self-pay

## 2023-06-04 VITALS — BP 154/86 | HR 76 | Temp 97.6°F | Ht 70.0 in | Wt 188.8 lb

## 2023-06-04 DIAGNOSIS — Z716 Tobacco abuse counseling: Secondary | ICD-10-CM

## 2023-06-04 DIAGNOSIS — I10 Essential (primary) hypertension: Secondary | ICD-10-CM | POA: Diagnosis not present

## 2023-06-04 DIAGNOSIS — Z125 Encounter for screening for malignant neoplasm of prostate: Secondary | ICD-10-CM | POA: Diagnosis not present

## 2023-06-04 DIAGNOSIS — R739 Hyperglycemia, unspecified: Secondary | ICD-10-CM | POA: Diagnosis not present

## 2023-06-04 DIAGNOSIS — E222 Syndrome of inappropriate secretion of antidiuretic hormone: Secondary | ICD-10-CM | POA: Diagnosis not present

## 2023-06-04 MED ORDER — ESCITALOPRAM OXALATE 10 MG PO TABS
10.0000 mg | ORAL_TABLET | Freq: Every day | ORAL | 5 refills | Status: DC
  Filled 2023-06-04: qty 30, 30d supply, fill #0
  Filled 2023-07-01: qty 30, 30d supply, fill #1
  Filled 2023-07-30: qty 30, 30d supply, fill #2
  Filled 2023-08-31: qty 30, 30d supply, fill #3
  Filled 2023-09-30: qty 30, 30d supply, fill #4
  Filled 2023-10-28 – 2023-11-04 (×2): qty 30, 30d supply, fill #5

## 2023-06-04 MED ORDER — VALSARTAN 320 MG PO TABS
320.0000 mg | ORAL_TABLET | Freq: Every day | ORAL | 3 refills | Status: AC
  Filled 2023-06-04: qty 90, 90d supply, fill #0
  Filled 2023-08-31: qty 90, 90d supply, fill #1
  Filled 2023-12-02: qty 90, 90d supply, fill #2

## 2023-06-04 MED ORDER — MOMETASONE FUROATE 50 MCG/ACT NA SUSP
2.0000 | Freq: Every day | NASAL | 12 refills | Status: AC
Start: 2023-06-04 — End: ?
  Filled 2023-06-04 – 2023-06-05 (×2): qty 17, 30d supply, fill #0

## 2023-06-04 MED ORDER — SILDENAFIL CITRATE 100 MG PO TABS
100.0000 mg | ORAL_TABLET | Freq: Every day | ORAL | 11 refills | Status: AC | PRN
  Filled 2023-06-04: qty 6, 30d supply, fill #0
  Filled 2023-08-31: qty 6, 30d supply, fill #1
  Filled 2023-09-30: qty 6, 30d supply, fill #2
  Filled 2024-01-01: qty 6, 30d supply, fill #3
  Filled 2024-01-29: qty 6, 30d supply, fill #4
  Filled 2024-03-02 – 2024-03-03 (×2): qty 6, 30d supply, fill #5

## 2023-06-04 MED ORDER — AMLODIPINE BESYLATE 10 MG PO TABS
10.0000 mg | ORAL_TABLET | Freq: Every day | ORAL | 3 refills | Status: AC
  Filled 2023-06-04 – 2023-07-01 (×2): qty 90, 90d supply, fill #0
  Filled 2023-09-30: qty 90, 90d supply, fill #1
  Filled 2024-01-01: qty 90, 90d supply, fill #2

## 2023-06-04 MED ORDER — NEBIVOLOL HCL 5 MG PO TABS
5.0000 mg | ORAL_TABLET | Freq: Every day | ORAL | 3 refills | Status: AC
Start: 2023-06-04 — End: ?
  Filled 2023-06-04: qty 90, 90d supply, fill #0

## 2023-06-04 MED ORDER — MELOXICAM 15 MG PO TABS
15.0000 mg | ORAL_TABLET | Freq: Every day | ORAL | 0 refills | Status: DC
  Filled 2023-06-04: qty 30, 30d supply, fill #0

## 2023-06-04 NOTE — Progress Notes (Signed)
 Subjective:    Patient ID: Patrick Ramsey, male    DOB: 04-23-1970, 53 y.o.   MRN: [id number] 05/24/22 Patient is a very pleasant 53 year old Caucasian gentleman with a history of hypertension.  I have placed the patient on valsartan  and hydrochlorothiazide  for severe hypertension.  He has been on hydrochlorothiazide  for approximately 2 months with no side effects that he was aware of.  His sodium when I last saw the patient was 134.  He recently went to [place].  He admits that he was drinking lots of fluids while in [place] including alcohol.  While standing in line to get on the plane to come back home, he had a syncopal episode.  There was no witnessed seizure activity.  They took him to the hospital.  In the hospital they did a CT scan of the head that was negative for any hemorrhage or stroke.  They ruled the patient out for heart attack.  Echocardiogram of the heart per the patient's report showed no damage to the heart.  He was monitored on telemetry for 3 days with no irregularities.  However his sodium level was found to be 116!Aaron Aas  Patient was taken off hydrochlorothiazide .  By the day of discharge, his sodium was 128.  He is here today for follow-up.  My plan at that time was; Patient has been eating salt to try to correct his hyponatremia.  We spent the majority of today's visit and discussion.  I suspect that he has SIADH.  I explained that the problem is too much fluid retention not too little salt in the blood vessels.  Therefore I recommended that we discontinue hydrochlorothiazide  indefinitely.  This will only exacerbate hyponatremia.  I will also check a sodium level today.  If his sodium level is still low, we may need to get the patient off valsartan  as well.  If necessary we will start the patient on amlodipine  as this should not have any effect on his sodium.  I will check a serum osmolality, urine osmolality, and a urine sodium.  If his urine sodium is elevated, if his urine osmolality is  elevated, and that this urine osmolality is low, this would confirm SIADH.  I recommended the patient try not to drink excessive fluids.  On normal days I recommended trying to maintain a fluid balance around 1200 mL.  If he does work hard and sweat I recommended drinking Gatorade to correct rather than free water.  06/04/23 Patient continues to smoke.  He is due for colon cancer screening.  He is due for lung cancer screening.  He declines these today.  He is due for prostate cancer screening.  He has not been taking the Nebivolol .  He is consistently taking valsartan  and amlodipine .  Not regularly checking his blood pressure.  He is having frequent, almost daily, attacks where he feels flushed and hot and dizzy and lightheaded.  He states this happens usually when people draw blood however now it is starting to happen on a daily basis even at work.  He then panics because he feels lightheaded.  He denies depression.  I feel the patient may be having vasovagal attacks or possible panic attacks Past Medical History:  Diagnosis Date   Hypertension    Hyponatremia    suspect SIADH   SIADH (syndrome of inappropriate ADH production) (HCC)    Smoker    Past Surgical History:  Procedure Laterality Date   KNEE ARTHROSCOPY WITH MEDIAL MENISECTOMY Left 10/03/2017   Procedure:  KNEE ARTHROSCOPY WITH MEDIAL MENISECTOMY DIAGNOSTIC;  Surgeon: Darrin Emerald, MD;  Location: [facility];  Service: Orthopedics;  Laterality: Left;   KNEE ARTHROSCOPY WITH MENISCAL REPAIR Left 10/10/2017   Procedure: KNEE ARTHROSCOPY WITH MEDIAL MENISCAL REPAIR;  Surgeon: Darrin Emerald, MD;  Location: [facility];  Service: Orthopedics;  Laterality: Left;   No current outpatient medications on file prior to visit.   No current facility-administered medications on file prior to visit.   Allergies  Allergen Reactions   Hydrochlorothiazide      Hyponatremia    Social History   Socioeconomic History   Marital status: Married     Spouse name: Not on file   Number of children: Not on file   Years of education: Not on file   Highest education level: Not on file  Occupational History   Not on file  Tobacco Use   Smoking status: Every Day    Current packs/day: 1.00    Average packs/day: 1 pack/day for 36.6 years (36.6 ttl pk-yrs)    Types: Cigarettes    Start date: 10/17/1986   Smokeless tobacco: Never  Vaping Use   Vaping status: Never Used  Substance and Sexual Activity   Alcohol use: Yes    Alcohol/week: 12.0 standard drinks of alcohol    Types: 12 Cans of beer per week    Comment: 12 cans on weekend   Drug use: No   Sexual activity: Yes  Other Topics Concern   Not on file  Social History Narrative   Not on file   Social Drivers of Health   Financial Resource Strain: Not on file  Food Insecurity: Not on file  Transportation Needs: Not on file  Physical Activity: Not on file  Stress: Not on file  Social Connections: Unknown (06/20/2021)   Received from [facility]   Social Network    Social Network: Not on file  Intimate Partner Violence: Unknown (05/12/2021)   Received from [facility], [facility]   HITS    Physically Hurt: Not on file    Insult or Talk Down To: Not on file    Threaten Physical Harm: Not on file    Scream or Curse: Not on file     Review of Systems  All other systems reviewed and are negative.      Objective:   Physical Exam Vitals reviewed.  Constitutional:      General: He is not in acute distress.    Appearance: Normal appearance. He is normal weight. He is not ill-appearing, toxic-appearing or diaphoretic.  HENT:     Head: Normocephalic and atraumatic.     Right Ear: Tympanic membrane and ear canal normal.     Left Ear: Tympanic membrane and ear canal normal.     Nose: Congestion and rhinorrhea present.     Mouth/Throat:     Mouth: Mucous membranes are moist.     Pharynx: Oropharynx is clear. No oropharyngeal exudate or posterior  oropharyngeal erythema.  Neck:     Vascular: No carotid bruit.  Cardiovascular:     Rate and Rhythm: Normal rate and regular rhythm.     Heart sounds: Normal heart sounds. No murmur heard.    No friction rub. No gallop.  Pulmonary:     Effort: Pulmonary effort is normal.     Breath sounds: Wheezing present. No rhonchi or rales.  Abdominal:     General: Abdomen is flat. Bowel sounds are normal. There is no distension.  Palpations: Abdomen is soft. There is no mass.  Musculoskeletal:     Cervical back: Normal range of motion and neck supple.     Right lower leg: No edema.     Left lower leg: No edema.  Lymphadenopathy:     Cervical: No cervical adenopathy.  Skin:    Coloration: Skin is not jaundiced.     Findings: No bruising, erythema, lesion or rash.  Neurological:     General: No focal deficit present.     Mental Status: He is alert and oriented to person, place, and time. Mental status is at baseline.     Cranial Nerves: No cranial nerve deficit.     Sensory: No sensory deficit.     Motor: No weakness.     Gait: Gait normal.          Assessment & Plan:  Prostate cancer screening - Plan: PSA  SIADH (syndrome of inappropriate ADH production) (HCC) - Plan: mometasone  (NASONEX ) 50 MCG/ACT nasal spray, COMPLETE METABOLIC PANEL WITHOUT GFR  Primary hypertension - Plan: nebivolol  (BYSTOLIC ) 5 MG tablet, valsartan  (DIOVAN ) 320 MG tablet, CBC with Differential/Platelet, COMPLETE METABOLIC PANEL WITHOUT GFR, Lipid panel Blood pressure is elevated today however the patient has an element of whitecoat syndrome.  I believe that he is having vasovagal attacks related to anxiety.  Recommended trying Lexapro 10 mg a day and rechecking in 2 months.  If this decreases the frequency of the attacks, monitor blood pressure to see if this improves as his anxiety level improves.  If not, Lumasilk automatic call with losartan and amlodipine .  Screen for prostate cancer with a PSA.  Check CBC  CMP and lipid panel.  Encouraged moderation.  Patient declines lung cancer screening and colon cancer screening

## 2023-06-05 ENCOUNTER — Other Ambulatory Visit: Payer: Self-pay

## 2023-06-05 ENCOUNTER — Other Ambulatory Visit (HOSPITAL_COMMUNITY): Payer: Self-pay

## 2023-06-07 LAB — COMPLETE METABOLIC PANEL WITHOUT GFR
AG Ratio: 1.8 (calc) (ref 1.0–2.5)
ALT: 55 U/L — ABNORMAL HIGH (ref 9–46)
AST: 34 U/L (ref 10–35)
Albumin: 4.7 g/dL (ref 3.6–5.1)
Alkaline phosphatase (APISO): 85 U/L (ref 35–144)
BUN: 10 mg/dL (ref 7–25)
CO2: 24 mmol/L (ref 20–32)
Calcium: 9.5 mg/dL (ref 8.6–10.3)
Chloride: 93 mmol/L — ABNORMAL LOW (ref 98–110)
Creat: 0.93 mg/dL (ref 0.70–1.30)
Globulin: 2.6 g/dL (ref 1.9–3.7)
Glucose, Bld: 149 mg/dL — ABNORMAL HIGH (ref 65–99)
Potassium: 4.3 mmol/L (ref 3.5–5.3)
Sodium: 128 mmol/L — ABNORMAL LOW (ref 135–146)
Total Bilirubin: 0.5 mg/dL (ref 0.2–1.2)
Total Protein: 7.3 g/dL (ref 6.1–8.1)

## 2023-06-07 LAB — LIPID PANEL
Cholesterol: 203 mg/dL — ABNORMAL HIGH (ref <200)
HDL: 60 mg/dL (ref >=40)
LDL Cholesterol (Calc): 114 mg/dL — ABNORMAL HIGH
Non-HDL Cholesterol (Calc): 143 mg/dL — ABNORMAL HIGH (ref <130)
Total CHOL/HDL Ratio: 3.4 (calc) (ref <5.0)
Triglycerides: 175 mg/dL — ABNORMAL HIGH (ref <150)

## 2023-06-07 LAB — CBC WITH DIFFERENTIAL/PLATELET
Absolute Lymphocytes: 1123 {cells}/uL (ref 850–3900)
Absolute Monocytes: 624 {cells}/uL (ref 200–950)
Basophils Absolute: 48 {cells}/uL (ref 0–200)
Basophils Relative: 1 %
Eosinophils Absolute: 48 {cells}/uL (ref 15–500)
Eosinophils Relative: 1 %
HCT: 48.3 % (ref 38.5–50.0)
Hemoglobin: 16.7 g/dL (ref 13.2–17.1)
MCH: 32.1 pg (ref 27.0–33.0)
MCHC: 34.6 g/dL (ref 32.0–36.0)
MCV: 92.9 fL (ref 80.0–100.0)
MPV: 9.8 fL (ref 7.5–12.5)
Monocytes Relative: 13 %
Neutro Abs: 2957 {cells}/uL (ref 1500–7800)
Neutrophils Relative %: 61.6 %
Platelets: 267 10*3/uL (ref 140–400)
RBC: 5.2 10*6/uL (ref 4.20–5.80)
RDW: 11.8 % (ref 11.0–15.0)
Total Lymphocyte: 23.4 %
WBC: 4.8 10*3/uL (ref 3.8–10.8)

## 2023-06-07 LAB — TEST AUTHORIZATION

## 2023-06-07 LAB — HEMOGLOBIN A1C
Hgb A1c MFr Bld: 5.2 % (ref <5.7)
Mean Plasma Glucose: 103 mg/dL
eAG (mmol/L): 5.7 mmol/L

## 2023-06-07 LAB — PSA: PSA: 0.71 ng/mL (ref <=4.00)

## 2023-06-12 ENCOUNTER — Other Ambulatory Visit

## 2023-06-12 DIAGNOSIS — E222 Syndrome of inappropriate secretion of antidiuretic hormone: Secondary | ICD-10-CM | POA: Diagnosis not present

## 2023-06-12 DIAGNOSIS — R7989 Other specified abnormal findings of blood chemistry: Secondary | ICD-10-CM | POA: Diagnosis not present

## 2023-06-12 LAB — COMPREHENSIVE METABOLIC PANEL WITH GFR
AG Ratio: 1.7 (calc) (ref 1.0–2.5)
ALT: 41 U/L (ref 9–46)
AST: 25 U/L (ref 10–35)
Albumin: 4.6 g/dL (ref 3.6–5.1)
Alkaline phosphatase (APISO): 77 U/L (ref 35–144)
BUN: 12 mg/dL (ref 7–25)
CO2: 28 mmol/L (ref 20–32)
Calcium: 9.8 mg/dL (ref 8.6–10.3)
Chloride: 98 mmol/L (ref 98–110)
Creat: 0.99 mg/dL (ref 0.70–1.30)
Globulin: 2.7 g/dL (ref 1.9–3.7)
Glucose, Bld: 104 mg/dL — ABNORMAL HIGH (ref 65–99)
Potassium: 4.7 mmol/L (ref 3.5–5.3)
Sodium: 131 mmol/L — ABNORMAL LOW (ref 135–146)
Total Bilirubin: 0.6 mg/dL (ref 0.2–1.2)
Total Protein: 7.3 g/dL (ref 6.1–8.1)
eGFR: 92 mL/min/{1.73_m2} (ref >=60)

## 2023-07-01 ENCOUNTER — Other Ambulatory Visit (HOSPITAL_COMMUNITY): Payer: Self-pay

## 2023-07-02 ENCOUNTER — Other Ambulatory Visit: Payer: Self-pay

## 2023-08-13 ENCOUNTER — Encounter: Payer: Self-pay | Admitting: Family Medicine

## 2023-08-13 ENCOUNTER — Ambulatory Visit (INDEPENDENT_AMBULATORY_CARE_PROVIDER_SITE_OTHER): Admitting: Family Medicine

## 2023-08-13 VITALS — BP 138/90 | HR 83 | Temp 98.1°F | Ht 70.0 in | Wt 190.2 lb

## 2023-08-13 DIAGNOSIS — Z0001 Encounter for general adult medical examination with abnormal findings: Secondary | ICD-10-CM

## 2023-08-13 DIAGNOSIS — Z0000 Encounter for general adult medical examination without abnormal findings: Secondary | ICD-10-CM

## 2023-08-13 DIAGNOSIS — E782 Mixed hyperlipidemia: Secondary | ICD-10-CM | POA: Diagnosis not present

## 2023-08-13 DIAGNOSIS — E222 Syndrome of inappropriate secretion of antidiuretic hormone: Secondary | ICD-10-CM

## 2023-08-13 DIAGNOSIS — Z122 Encounter for screening for malignant neoplasm of respiratory organs: Secondary | ICD-10-CM

## 2023-08-13 DIAGNOSIS — I10 Essential (primary) hypertension: Secondary | ICD-10-CM | POA: Diagnosis not present

## 2023-08-13 DIAGNOSIS — Z1211 Encounter for screening for malignant neoplasm of colon: Secondary | ICD-10-CM

## 2023-08-13 NOTE — Progress Notes (Signed)
 Subjective:    Patient ID: Patrick Ramsey, male    DOB: 07/12/1970, 53 y.o.   MRN: [id number] 05/24/22 Patient is a very pleasant 53 year old Caucasian gentleman with a history of hypertension.  I have placed the patient on valsartan  and hydrochlorothiazide  for severe hypertension.  He has been on hydrochlorothiazide  for approximately 2 months with no side effects that he was aware of.  His sodium when I last saw the patient was 134.  He recently went to [place].  He admits that he was drinking lots of fluids while in [place] including alcohol.  While standing in line to get on the plane to come back home, he had a syncopal episode.  There was no witnessed seizure activity.  They took him to the hospital.  In the hospital they did a CT scan of the head that was negative for any hemorrhage or stroke.  They ruled the patient out for heart attack.  Echocardiogram of the heart per the patient's report showed no damage to the heart.  He was monitored on telemetry for 3 days with no irregularities.  However his sodium level was found to be 116!SABRA  Patient was taken off hydrochlorothiazide .  By the day of discharge, his sodium was 128.  He is here today for follow-up.  My plan at that time was; Patient has been eating salt to try to correct his hyponatremia.  We spent the majority of today's visit and discussion.  I suspect that he has SIADH.  I explained that the problem is too much fluid retention not too little salt in the blood vessels.  Therefore I recommended that we discontinue hydrochlorothiazide  indefinitely.  This will only exacerbate hyponatremia.  I will also check a sodium level today.  If his sodium level is still low, we may need to get the patient off valsartan  as well.  If necessary we will start the patient on amlodipine  as this should not have any effect on his sodium.  I will check a serum osmolality, urine osmolality, and a urine sodium.  If his urine sodium is elevated, if his urine osmolality is  elevated, and that this urine osmolality is low, this would confirm SIADH.  I recommended the patient try not to drink excessive fluids.  On normal days I recommended trying to maintain a fluid balance around 1200 mL.  If he does work hard and sweat I recommended drinking Gatorade to correct rather than free water.  06/04/23 Patient continues to smoke.  He is due for colon cancer screening.  He is due for lung cancer screening.  He declines these today.  He is due for prostate cancer screening.  He has not been taking the Nebivolol .  He is consistently taking valsartan  and amlodipine .  Not regularly checking his blood pressure.  He is having frequent, almost daily, attacks where he feels flushed and hot and dizzy and lightheaded.  He states this happens usually when people draw blood however now it is starting to happen on a daily basis even at work.  He then panics because he feels lightheaded.  He denies depression.  I feel the patient may be having vasovagal attacks or possible panic attacks.  At that time, my plan was: Blood pressure is elevated today however the patient has an element of whitecoat syndrome.  I believe that he is having vasovagal attacks related to anxiety.  Recommended trying Lexapro  10 mg a day and rechecking in 2 months.  If this decreases the frequency of  the attacks, monitor blood pressure to see if this improves as his anxiety level improves.  I.  Screen for prostate cancer with a PSA.  Check CBC CMP and lipid panel.  Encouraged moderation of alcohol.  Patient declines lung cancer screening and colon cancer screening.  08/13/23 Patient is here today for his complete physical exam.  Since starting Lexapro , he denies any further panic attack/vasovagal attacks.  Otherwise he is doing well.  He continues to smoke.  He is in the precontemplative phase of smoking cessation.  He is due for the pneumonia vaccine.  He is also due for shingles shot.  He declines both vaccinations today.  He is  overdue for colon cancer screening.  He refuses a colonoscopy but would consider Cologuard.  He is also due for lung cancer screening and after discussing this with him further he is willing to proceed with that.  His blood pressure is borderline today.  He has not been taking Nebivolol  Past Medical History:  Diagnosis Date   Hypertension    Hyponatremia    suspect SIADH   SIADH (syndrome of inappropriate ADH production) (HCC)    Smoker    Past Surgical History:  Procedure Laterality Date   KNEE ARTHROSCOPY WITH MEDIAL MENISECTOMY Left 10/03/2017   Procedure: KNEE ARTHROSCOPY WITH MEDIAL MENISECTOMY DIAGNOSTIC;  Surgeon: Margrette Taft BRAVO, MD;  Location: [facility];  Service: Orthopedics;  Laterality: Left;   KNEE ARTHROSCOPY WITH MENISCAL REPAIR Left 10/10/2017   Procedure: KNEE ARTHROSCOPY WITH MEDIAL MENISCAL REPAIR;  Surgeon: Margrette Taft BRAVO, MD;  Location: [facility];  Service: Orthopedics;  Laterality: Left;   Current Outpatient Medications on File Prior to Visit  Medication Sig Dispense Refill   amLODipine  (NORVASC ) 10 MG tablet Take 1 tablet (10 mg total) by mouth daily. 90 tablet 3   escitalopram  (LEXAPRO ) 10 MG tablet Take 1 tablet (10 mg total) by mouth daily. 30 tablet 5   meloxicam  (MOBIC ) 15 MG tablet Take 1 tablet (15 mg total) by mouth daily. (Patient not taking: Reported on 06/04/2023) 30 tablet 0   mometasone  (NASONEX ) 50 MCG/ACT nasal spray Place 2 sprays into the nose daily. 17 g 12   nebivolol  (BYSTOLIC ) 5 MG tablet Take 1 tablet (5 mg total) by mouth daily. (Patient not taking: Reported on 06/04/2023) 90 tablet 3   sildenafil  (VIAGRA ) 100 MG tablet Take 1 tablet (100 mg total) by mouth daily as needed for erectile dysfunction. 10 tablet 11   valsartan  (DIOVAN ) 320 MG tablet Take 1 tablet (320 mg total) by mouth daily. *please make appt* 90 tablet 3   No current facility-administered medications on file prior to visit.   Allergies  Allergen Reactions   Hydrochlorothiazide       Hyponatremia    Social History   Socioeconomic History   Marital status: Married    Spouse name: Not on file   Number of children: Not on file   Years of education: Not on file   Highest education level: Not on file  Occupational History   Not on file  Tobacco Use   Smoking status: Every Day    Current packs/day: 1.00    Average packs/day: 1 pack/day for 36.8 years (36.8 ttl pk-yrs)    Types: Cigarettes    Start date: 10/17/1986   Smokeless tobacco: Never  Vaping Use   Vaping status: Never Used  Substance and Sexual Activity   Alcohol use: Yes    Alcohol/week: 12.0 standard drinks of alcohol    Types: 12  Cans of beer per week    Comment: 12 cans on weekend   Drug use: No   Sexual activity: Yes  Other Topics Concern   Not on file  Social History Narrative   Not on file   Social Drivers of Health   Financial Resource Strain: Not on file  Food Insecurity: Not on file  Transportation Needs: Not on file  Physical Activity: Not on file  Stress: Not on file  Social Connections: Unknown (06/20/2021)   Received from [facility]   Social Network    Social Network: Not on file  Intimate Partner Violence: Unknown (05/12/2021)   Received from [facility]   HITS    Physically Hurt: Not on file    Insult or Talk Down To: Not on file    Threaten Physical Harm: Not on file    Scream or Curse: Not on file     Review of Systems  All other systems reviewed and are negative.      Objective:   Physical Exam Vitals reviewed.  Constitutional:      General: He is not in acute distress.    Appearance: Normal appearance. He is normal weight. He is not ill-appearing, toxic-appearing or diaphoretic.  HENT:     Head: Normocephalic and atraumatic.     Right Ear: Tympanic membrane and ear canal normal.     Left Ear: Tympanic membrane and ear canal normal.     Nose: Congestion and rhinorrhea present.     Mouth/Throat:     Mouth: Mucous membranes are moist.     Pharynx:  Oropharynx is clear. No oropharyngeal exudate or posterior oropharyngeal erythema.  Neck:     Vascular: No carotid bruit.  Cardiovascular:     Rate and Rhythm: Normal rate and regular rhythm.     Heart sounds: Normal heart sounds. No murmur heard.    No friction rub. No gallop.  Pulmonary:     Effort: Pulmonary effort is normal.     Breath sounds: Wheezing present. No rhonchi or rales.  Abdominal:     General: Abdomen is flat. Bowel sounds are normal. There is no distension.     Palpations: Abdomen is soft. There is no mass.  Musculoskeletal:     Cervical back: Normal range of motion and neck supple.     Right lower leg: No edema.     Left lower leg: No edema.  Lymphadenopathy:     Cervical: No cervical adenopathy.  Skin:    Coloration: Skin is not jaundiced.     Findings: No bruising, erythema, lesion or rash.  Neurological:     General: No focal deficit present.     Mental Status: He is alert and oriented to person, place, and time. Mental status is at baseline.     Cranial Nerves: No cranial nerve deficit.     Sensory: No sensory deficit.     Motor: No weakness.     Gait: Gait normal.           Assessment & Plan:  SIADH (syndrome of inappropriate ADH production) (HCC)  Primary hypertension  General medical exam  Mixed hyperlipidemia  Colon cancer screening - Plan: Cologuard  Screening for lung cancer - Plan: CT CHEST LUNG CA SCREEN LOW DOSE W/O CM I will schedule the patient for a CT scan of his lungs to screen for lung cancer.  I have ordered Cologuard to screen for colon cancer.  PSA is normal/prostate cancer screening is up-to-date.  Blood  pressure is borderline.  Encouraged the patient to check it daily.  If consistently greater than 140/90 I would resume Nebivolol  in addition to his losartan and amlodipine .  Encouraged smoking cessation.  Strongly recommended Prevnar 20 as well as Shingrix but patient politely declined.  Continue Lexapro  for a total of 6 months  then try to wean back.  Recommended fluid restriction to 40 ounces a day to manage SIADH

## 2023-08-15 ENCOUNTER — Encounter: Payer: Self-pay | Admitting: Family Medicine

## 2023-08-21 ENCOUNTER — Ambulatory Visit
Admission: RE | Admit: 2023-08-21 | Discharge: 2023-08-21 | Disposition: A | Discharge status: Home / Self Care | Source: Ambulatory Visit | Attending: Family Medicine | Admitting: Family Medicine

## 2023-08-21 DIAGNOSIS — Z122 Encounter for screening for malignant neoplasm of respiratory organs: Secondary | ICD-10-CM

## 2023-08-21 DIAGNOSIS — F17210 Nicotine dependence, cigarettes, uncomplicated: Secondary | ICD-10-CM | POA: Diagnosis not present

## 2023-09-02 ENCOUNTER — Ambulatory Visit: Payer: Self-pay | Admitting: Family Medicine

## 2023-10-23 DIAGNOSIS — Z1211 Encounter for screening for malignant neoplasm of colon: Secondary | ICD-10-CM | POA: Diagnosis not present

## 2023-10-27 LAB — COLOGUARD: COLOGUARD: POSITIVE — AB

## 2023-10-28 ENCOUNTER — Other Ambulatory Visit: Payer: Self-pay

## 2023-10-28 DIAGNOSIS — Z1211 Encounter for screening for malignant neoplasm of colon: Secondary | ICD-10-CM

## 2023-10-29 ENCOUNTER — Other Ambulatory Visit (HOSPITAL_COMMUNITY): Payer: Self-pay

## 2023-10-29 ENCOUNTER — Encounter: Payer: Self-pay | Admitting: Pharmacist

## 2023-10-29 ENCOUNTER — Other Ambulatory Visit: Payer: Self-pay

## 2023-11-01 ENCOUNTER — Other Ambulatory Visit: Payer: Self-pay

## 2023-11-04 ENCOUNTER — Other Ambulatory Visit: Payer: Self-pay

## 2023-11-04 ENCOUNTER — Telehealth: Payer: Self-pay

## 2023-11-04 DIAGNOSIS — Z1211 Encounter for screening for malignant neoplasm of colon: Secondary | ICD-10-CM

## 2023-11-04 DIAGNOSIS — R195 Other fecal abnormalities: Secondary | ICD-10-CM

## 2023-11-04 MED ORDER — NA SULFATE-K SULFATE-MG SULF 17.5-3.13-1.6 GM/177ML PO SOLN
1.0000 | Freq: Once | ORAL | 0 refills | Status: AC
  Filled 2023-11-04 – 2023-12-03 (×2): qty 354, 1d supply, fill #0

## 2023-11-04 NOTE — Telephone Encounter (Signed)
 Gastroenterology Pre-Procedure Review  Request Date: 12/22/23 Requesting Physician: Dr. Marinda  PATIENT REVIEW QUESTIONS: The patient responded to the following health history questions as indicated:    1. Are you having any GI issues? 1st colonoscopy, positive cologuard 2. Do you have a personal history of Polyps? no 3. Do you have a family history of Colon Cancer or Polyps? no 4. Diabetes Mellitus? no 5. Joint replacements in the past 12 months?no 6. Major health problems in the past 3 months?no 7. Any artificial heart valves, MVP, or defibrillator?no    MEDICATIONS & ALLERGIES:    Patient reports the following regarding taking any anticoagulation/antiplatelet therapy:   Plavix, Coumadin, Eliquis, Xarelto, Lovenox, Pradaxa, Brilinta, or Effient? no Aspirin? no  Patient confirms/reports the following medications:  Current Outpatient Medications  Medication Sig Dispense Refill   amLODipine  (NORVASC ) 10 MG tablet Take 1 tablet (10 mg total) by mouth daily. 90 tablet 3   escitalopram  (LEXAPRO ) 10 MG tablet Take 1 tablet (10 mg total) by mouth daily. 30 tablet 5   meloxicam  (MOBIC ) 15 MG tablet Take 1 tablet (15 mg total) by mouth daily. 30 tablet 0   mometasone  (NASONEX ) 50 MCG/ACT nasal spray Place 2 sprays into the nose daily. 17 g 12   nebivolol  (BYSTOLIC ) 5 MG tablet Take 1 tablet (5 mg total) by mouth daily. (Patient not taking: Reported on 08/13/2023) 90 tablet 3   sildenafil  (VIAGRA ) 100 MG tablet Take 1 tablet (100 mg total) by mouth daily as needed for erectile dysfunction. 10 tablet 11   valsartan  (DIOVAN ) 320 MG tablet Take 1 tablet (320 mg total) by mouth daily. *please make appt* 90 tablet 3   No current facility-administered medications for this visit.    Patient confirms/reports the following allergies:  Allergies  Allergen Reactions   Hydrochlorothiazide      Hyponatremia     No orders of the defined types were placed in this encounter.   AUTHORIZATION  INFORMATION Primary Insurance: 1D#: Group #:  Secondary Insurance: 1D#: Group #:  SCHEDULE INFORMATION: Date: 01/01/24 Time: Location: [facility]

## 2023-11-14 ENCOUNTER — Other Ambulatory Visit: Payer: Self-pay

## 2023-11-28 ENCOUNTER — Other Ambulatory Visit: Payer: Self-pay | Admitting: Family Medicine

## 2023-11-29 ENCOUNTER — Other Ambulatory Visit (HOSPITAL_COMMUNITY): Payer: Self-pay

## 2023-11-29 MED ORDER — ESCITALOPRAM OXALATE 10 MG PO TABS
10.0000 mg | ORAL_TABLET | Freq: Every day | ORAL | 5 refills | Status: AC
  Filled 2023-11-29: qty 30, 30d supply, fill #0
  Filled 2024-01-01: qty 30, 30d supply, fill #1
  Filled 2024-01-29: qty 30, 30d supply, fill #2
  Filled 2024-03-02 – 2024-03-03 (×2): qty 30, 30d supply, fill #3

## 2023-12-03 ENCOUNTER — Other Ambulatory Visit (HOSPITAL_COMMUNITY): Payer: Self-pay

## 2023-12-03 ENCOUNTER — Other Ambulatory Visit: Payer: Self-pay

## 2023-12-06 ENCOUNTER — Other Ambulatory Visit (HOSPITAL_BASED_OUTPATIENT_CLINIC_OR_DEPARTMENT_OTHER): Payer: Self-pay

## 2024-01-01 ENCOUNTER — Ambulatory Visit: Admitting: Anesthesiology

## 2024-01-01 ENCOUNTER — Encounter
Admission: RE | Disposition: A | Discharge status: Home / Self Care | Payer: Self-pay | Source: Home / Self Care | Attending: General Surgery

## 2024-01-01 ENCOUNTER — Encounter: Payer: Self-pay | Admitting: General Surgery

## 2024-01-01 ENCOUNTER — Ambulatory Visit
Admission: RE | Admit: 2024-01-01 | Discharge: 2024-01-01 | Disposition: A | Discharge status: Home / Self Care | Attending: General Surgery | Admitting: General Surgery

## 2024-01-01 DIAGNOSIS — K635 Polyp of colon: Secondary | ICD-10-CM | POA: Diagnosis not present

## 2024-01-01 DIAGNOSIS — D123 Benign neoplasm of transverse colon: Secondary | ICD-10-CM | POA: Diagnosis not present

## 2024-01-01 DIAGNOSIS — K644 Residual hemorrhoidal skin tags: Secondary | ICD-10-CM | POA: Insufficient documentation

## 2024-01-01 DIAGNOSIS — Z7951 Long term (current) use of inhaled steroids: Secondary | ICD-10-CM | POA: Diagnosis not present

## 2024-01-01 DIAGNOSIS — Z79899 Other long term (current) drug therapy: Secondary | ICD-10-CM | POA: Insufficient documentation

## 2024-01-01 DIAGNOSIS — K641 Second degree hemorrhoids: Secondary | ICD-10-CM | POA: Diagnosis not present

## 2024-01-01 DIAGNOSIS — K5730 Diverticulosis of large intestine without perforation or abscess without bleeding: Secondary | ICD-10-CM | POA: Diagnosis not present

## 2024-01-01 DIAGNOSIS — E785 Hyperlipidemia, unspecified: Secondary | ICD-10-CM | POA: Diagnosis not present

## 2024-01-01 DIAGNOSIS — F17210 Nicotine dependence, cigarettes, uncomplicated: Secondary | ICD-10-CM | POA: Insufficient documentation

## 2024-01-01 DIAGNOSIS — I10 Essential (primary) hypertension: Secondary | ICD-10-CM | POA: Diagnosis not present

## 2024-01-01 DIAGNOSIS — Z791 Long term (current) use of non-steroidal anti-inflammatories (NSAID): Secondary | ICD-10-CM | POA: Diagnosis not present

## 2024-01-01 DIAGNOSIS — Z1211 Encounter for screening for malignant neoplasm of colon: Secondary | ICD-10-CM | POA: Diagnosis not present

## 2024-01-01 DIAGNOSIS — D125 Benign neoplasm of sigmoid colon: Secondary | ICD-10-CM | POA: Insufficient documentation

## 2024-01-01 DIAGNOSIS — R195 Other fecal abnormalities: Secondary | ICD-10-CM | POA: Diagnosis not present

## 2024-01-01 DIAGNOSIS — K648 Other hemorrhoids: Secondary | ICD-10-CM | POA: Diagnosis not present

## 2024-01-01 HISTORY — PX: POLYPECTOMY: SHX149

## 2024-01-01 HISTORY — PX: COLONOSCOPY: SHX5424

## 2024-01-01 SURGERY — COLONOSCOPY
Anesthesia: General

## 2024-01-01 MED ORDER — PROPOFOL 10 MG/ML IV BOLUS
INTRAVENOUS | Status: DC | PRN
  Administered 2024-01-01: 100 mg via INTRAVENOUS

## 2024-01-01 MED ORDER — SODIUM CHLORIDE 0.9 % IV SOLN
INTRAVENOUS | Status: DC
  Administered 2024-01-01: 20 mL/h via INTRAVENOUS

## 2024-01-01 MED ORDER — PROPOFOL 500 MG/50ML IV EMUL
INTRAVENOUS | Status: DC | PRN
  Administered 2024-01-01: 175 ug/kg/min via INTRAVENOUS

## 2024-01-01 MED ORDER — LIDOCAINE HCL (CARDIAC) PF 100 MG/5ML IV SOSY
PREFILLED_SYRINGE | INTRAVENOUS | Status: DC | PRN
  Administered 2024-01-01: 60 mg via INTRAVENOUS

## 2024-01-01 MED ORDER — PHENYLEPHRINE 80 MCG/ML (10ML) SYRINGE FOR IV PUSH (FOR BLOOD PRESSURE SUPPORT)
PREFILLED_SYRINGE | INTRAVENOUS | Status: AC
  Filled 2024-01-01: qty 10

## 2024-01-01 MED ORDER — PHENYLEPHRINE 80 MCG/ML (10ML) SYRINGE FOR IV PUSH (FOR BLOOD PRESSURE SUPPORT)
PREFILLED_SYRINGE | INTRAVENOUS | Status: DC | PRN
  Administered 2024-01-01 (×5): 80 ug via INTRAVENOUS

## 2024-01-01 NOTE — Discharge Instructions (Signed)
 YOU HAD AN ENDOSCOPIC PROCEDURE TODAY: Refer to the procedure report that was given to you for any specific questions about what was found during the examination.  If the procedure report does not answer your questions, please call your gastroenterologist to clarify.  YOU SHOULD EXPECT: Some feelings of bloating in the abdomen. Passage of more gas than usual.  Walking can help get rid of the air that was put into your GI tract during the procedure and reduce the bloating. If you had a lower endoscopy (such as a colonoscopy or flexible sigmoidoscopy) you may notice spotting of blood in your stool or on the toilet paper.   DIET: Your first meal following the procedure should be a light meal and then it is ok to progress to your normal diet.  A half-sandwich or bowl of soup is an example of a good first meal.  Heavy or fried foods are harder to digest and may make you feel nasueas or bloated.  Drink plenty of fluids but you should avoid alcoholic beverages for 24 hours.  ACTIVITY: Your care partner should take you home directly after the procedure.  You should plan to take it easy, moving slowly for the rest of the day.  You can resume normal activity the day after the procedure however you should NOT DRIVE, make legal decisions or use heavy machinery for 24 hours (because of the sedation medicines used during the test).    SYMPTOMS TO REPORT IMMEDIATELY  A gastroenterologist can be reached at any hour.  Please call your doctor's office for any of the following symptoms:  Following lower endoscopy (colonoscopy, flexible sigmoidoscopy)  Excessive amounts of blood in the stool  Significant tenderness, worsening of abdominal pains  Swelling of the abdomen that is new, acute  Fever of 100 or higher Following upper endoscopy (EGD, EUS, ERCP)  Vomiting of blood or coffee ground material  New, significant abdominal pain  New, significant chest pain or pain under the shoulder blades  Painful or  persistently difficult swallowing  New shortness of breath  Black, tarry-looking stools  FOLLOW UP: If any biopsies were taken you will be contacted by phone or by letter within the next 1-3 weeks.  Call your gastroenterologist if you have not heard about the biopsies in 3 weeks.   Please also call your gastroenterologist's office with any specific questions about appointments or follow up tests.

## 2024-01-01 NOTE — Transfer of Care (Signed)
 Immediate Anesthesia Transfer of Care Note  Patient: Patrick Ramsey  Procedure(s) Performed: COLONOSCOPY POLYPECTOMY, INTESTINE  Patient Location: Endoscopy Unit  Anesthesia Type:General  Level of Consciousness: oriented and drowsy  Airway & Oxygen Therapy: Patient Spontanous Breathing  Post-op Assessment: Report given to RN and Post -op Vital signs reviewed and stable  Post vital signs: Reviewed and stable  Last Vitals:  Vitals Value Taken Time  BP 103/71 01/01/24 10:09  Temp    Pulse 81 01/01/24 10:11  Resp 12 01/01/24 10:11  SpO2 97 % 01/01/24 10:11  Vitals shown include unfiled device data.  Last Pain:  Vitals:   01/01/24 0839  TempSrc: Temporal  PainSc: 0-No pain         Complications: No notable events documented.

## 2024-01-01 NOTE — Anesthesia Postprocedure Evaluation (Signed)
 Anesthesia Post Note  Patient: Patrick Ramsey  Procedure(s) Performed: COLONOSCOPY POLYPECTOMY, INTESTINE  Patient location during evaluation: PACU Anesthesia Type: General Level of consciousness: awake and alert Pain management: pain level controlled Vital Signs Assessment: post-procedure vital signs reviewed and stable Respiratory status: spontaneous breathing, nonlabored ventilation and respiratory function stable Cardiovascular status: blood pressure returned to baseline and stable Postop Assessment: no apparent nausea or vomiting Anesthetic complications: no   No notable events documented.   Last Vitals:  Vitals:   01/01/24 1019 01/01/24 1029  BP: 106/84 134/85  Pulse: 84 74  Resp: 19 16  Temp:    SpO2: 96% 98%    Last Pain:  Vitals:   01/01/24 1029  TempSrc:   PainSc: 0-No pain                 Camellia Merilee Louder

## 2024-01-01 NOTE — Anesthesia Preprocedure Evaluation (Addendum)
 Anesthesia Evaluation  Patient identified by MRN, date of birth, ID band Patient awake    Reviewed: Allergy & Precautions, H&P , NPO status , Patient's Chart, lab work & pertinent test results  Airway Mallampati: II  TM Distance: >3 FB Neck ROM: full    Dental  (+) Upper Dentures, Lower Dentures   Pulmonary Current Smoker and Patient abstained from smoking.   Pulmonary exam normal        Cardiovascular hypertension, Normal cardiovascular exam     Neuro/Psych negative neurological ROS  negative psych ROS   GI/Hepatic negative GI ROS, Neg liver ROS,,,  Endo/Other  negative endocrine ROS    Renal/GU negative Renal ROS  negative genitourinary   Musculoskeletal   Abdominal Normal abdominal exam  (+)   Peds  Hematology negative hematology ROS (+)   Anesthesia Other Findings Past Medical History: No date: Hypertension No date: Hyponatremia     Comment:  suspect SIADH No date: SIADH (syndrome of inappropriate ADH production) No date: Smoker  Past Surgical History: 10/03/2017: KNEE ARTHROSCOPY WITH MEDIAL MENISECTOMY; Left     Comment:  Procedure: KNEE ARTHROSCOPY WITH MEDIAL MENISECTOMY               DIAGNOSTIC;  Surgeon: Margrette Taft BRAVO, MD;  Location:              [facility];  Service: Orthopedics;  Laterality: Left; 10/10/2017: KNEE ARTHROSCOPY WITH MENISCAL REPAIR; Left     Comment:  Procedure: KNEE ARTHROSCOPY WITH MEDIAL MENISCAL REPAIR;              Surgeon: Margrette Taft BRAVO, MD;  Location: [facility];                Service: Orthopedics;  Laterality: Left;     Reproductive/Obstetrics negative OB ROS                              Anesthesia Physical Anesthesia Plan  ASA: 2  Anesthesia Plan: General   Post-op Pain Management: Minimal or no pain anticipated   Induction: Intravenous  PONV Risk Score and Plan: Propofol  infusion and TIVA  Airway Management Planned: Natural  Airway  Additional Equipment:   Intra-op Plan:   Post-operative Plan:   Informed Consent: I have reviewed the patients History and Physical, chart, labs and discussed the procedure including the risks, benefits and alternatives for the proposed anesthesia with the patient or authorized representative who has indicated his/her understanding and acceptance.     Dental Advisory Given  Plan Discussed with: CRNA and Surgeon  Anesthesia Plan Comments:          Anesthesia Quick Evaluation

## 2024-01-01 NOTE — H&P (Signed)
 Primary Care Physician:  Duanne [name], MD Primary Gastroenterologist:  Dr. Marinda  Pre-Procedure History & Physical: HPI:  Patrick Ramsey is a 54 y.o. male is here for an colonoscopy.   Past Medical History:  Diagnosis Date   Hypertension    Hyponatremia    suspect SIADH   SIADH (syndrome of inappropriate ADH production)    Smoker     Past Surgical History:  Procedure Laterality Date   KNEE ARTHROSCOPY WITH MEDIAL MENISECTOMY Left 10/03/2017   Procedure: KNEE ARTHROSCOPY WITH MEDIAL MENISECTOMY DIAGNOSTIC;  Surgeon: Margrette Taft BRAVO, MD;  Location: [facility];  Service: Orthopedics;  Laterality: Left;   KNEE ARTHROSCOPY WITH MENISCAL REPAIR Left 10/10/2017   Procedure: KNEE ARTHROSCOPY WITH MEDIAL MENISCAL REPAIR;  Surgeon: Margrette Taft BRAVO, MD;  Location: [facility];  Service: Orthopedics;  Laterality: Left;    Prior to Admission medications   Medication Sig Start Date End Date Taking? Authorizing Provider  amLODipine  (NORVASC ) 10 MG tablet Take 1 tablet (10 mg total) by mouth daily. 06/04/23  Yes Duanne [name], MD  escitalopram  (LEXAPRO ) 10 MG tablet Take 1 tablet (10 mg total) by mouth daily. 11/29/23  Yes Duanne [name], MD  meloxicam  (MOBIC ) 15 MG tablet Take 1 tablet (15 mg total) by mouth daily. 06/04/23  Yes Duanne [name], MD  mometasone  (NASONEX ) 50 MCG/ACT nasal spray Place 2 sprays into the nose daily. 06/04/23  Yes Duanne [name], MD  nebivolol  (BYSTOLIC ) 5 MG tablet Take 1 tablet (5 mg total) by mouth daily. 06/04/23  Yes Duanne [name], MD  sildenafil  (VIAGRA ) 100 MG tablet Take 1 tablet (100 mg total) by mouth daily as needed for erectile dysfunction. 06/04/23  Yes Duanne [name], MD  valsartan  (DIOVAN ) 320 MG tablet Take 1 tablet (320 mg total) by mouth daily. *please make appt* 06/04/23  Yes Duanne [name], MD    Allergies as of 11/04/2023 - Review Complete 06/04/2023  Allergen Reaction Noted   Hydrochlorothiazide   05/24/2022    Family History   Problem Relation Age of Onset   Uterine cancer Mother    Hypertension Father     Social History   Socioeconomic History   Marital status: Married    Spouse name: Not on file   Number of children: Not on file   Years of education: Not on file   Highest education level: Not on file  Occupational History   Not on file  Tobacco Use   Smoking status: Every Day    Current packs/day: 1.00    Average packs/day: 1 pack/day for 37.2 years (37.2 ttl pk-yrs)    Types: Cigarettes    Start date: 10/17/1986   Smokeless tobacco: Never  Vaping Use   Vaping status: Never Used  Substance and Sexual Activity   Alcohol use: Yes    Alcohol/week: 12.0 standard drinks of alcohol    Types: 12 Cans of beer per week    Comment: 12 cans on weekend   Drug use: No   Sexual activity: Yes  Other Topics Concern   Not on file  Social History Narrative   Not on file   Social Drivers of Health   Financial Resource Strain: Not on file  Food Insecurity: Not on file  Transportation Needs: Not on file  Physical Activity: Not on file  Stress: Not on file  Social Connections: Unknown (06/20/2021)   [facility] from [facility]   Social Network    Social Network: Not on file  Intimate  Partner Violence: Unknown (05/12/2021)   Received from [facility]   HITS    Physically Hurt: Not on file    Insult or Talk Down To: Not on file    Threaten Physical Harm: Not on file    Scream or Curse: Not on file    Review of Systems: See HPI, otherwise negative ROS  Physical Exam: BP 117/83   Pulse 83   Temp (!) 96.1 F (35.6 C) (Temporal)   Resp 20   Ht 5' 9.5 (1.765 m)   Wt 87 kg   SpO2 100%   BMI 27.92 kg/m  General:   Alert,  pleasant and cooperative in NAD Head:  Normocephalic and atraumatic. Neck:  Supple; no masses or thyromegaly. Lungs:  Clear throughout to auscultation.    Heart:  Regular rate and rhythm. Abdomen:  Soft, nontender and nondistended. Normal bowel sounds, without guarding, and  without rebound.   Neurologic:  Alert and  oriented x4;  grossly normal neurologically.  Impression/Plan: Patrick Ramsey is here for an colonoscopy to be performed for positive cologuard  Risks, benefits, limitations, and alternatives regarding  colonoscopy have been reviewed with the patient.  Questions have been answered.  All parties agreeable.   Patrick MALVA Endow, MD  01/01/2024, 9:27 AM

## 2024-01-01 NOTE — Op Note (Signed)
 [facility] Patient Name: Patrick Ramsey Procedure Date: 01/01/2024 9:21 AM MRN: [id number] Account #: [id number] Date of Birth: May 13, 1970 Admit Type: Outpatient Age: 53 Room: [facility] ENDO ROOM 1 Gender: Male Note Status: Finalized Instrument Name: Colon Scope [id number] Procedure:             Colonoscopy Indications:           Screening for colorectal malignant neoplasm due to                         positive Cologuard test Providers:             Jayson KIDD. Marinda, MD Referring MD:          [name]. [name] (Referring MD) Medicines:             See the Anesthesia note for documentation of the                         administered medications Complications:         No immediate complications. Estimated blood loss:                         Minimal. Procedure:             Pre-Anesthesia Assessment:                        - Prior to the procedure, a History and Physical was                         performed, and patient medications and allergies were                         reviewed. The patient's tolerance of previous                         anesthesia was also reviewed. The risks and benefits                         of the procedure and the sedation options and risks                         were discussed with the patient. All questions were                         answered, and informed consent was obtained. Prior                         Anticoagulants: The patient has taken no anticoagulant                         or antiplatelet agents except for aspirin. ASA Grade                         Assessment: II - A patient with mild systemic disease.                         After reviewing the risks and benefits, the patient  was deemed in satisfactory condition to undergo the                         procedure.                        After obtaining informed consent, the colonoscope was                         passed under direct vision.  Throughout the procedure,                         the patient's blood pressure, pulse, and oxygen                         saturations were monitored continuously. The                         Colonoscope was introduced through the anus and                         advanced to the the cecum, identified by appendiceal                         orifice and ileocecal valve. The colonoscopy was                         performed without difficulty. The patient tolerated                         the procedure well. The quality of the bowel                         preparation was good. Findings:      Skin tags were found on perianal exam.      A few large-mouthed diverticula were found in the mid ascending colon.      A 12 mm polyp was found in the sigmoid colon. The polyp was       semi-pedunculated. The polyp was removed with a hot snare. Resection and       retrieval were complete. Estimated blood loss was minimal.      A 4 mm polyp was found in the distal transverse colon. The polyp was       sessile. The polyp was removed with a cold snare. Resection and       retrieval were complete.      External and internal hemorrhoids were found during retroflexion. The       hemorrhoids were Grade II (internal hemorrhoids that prolapse but reduce       spontaneously). Impression:            - Perianal skin tags found on perianal exam.                        - Diverticulosis in the mid ascending colon.                        - One 12 mm polyp in the sigmoid colon, removed with a  hot snare. Resected and retrieved.                        - One 4 mm polyp in the distal transverse colon,                         removed with a cold snare. Resected and retrieved.                        - External and internal hemorrhoids. Recommendation:        - Discharge patient to home (ambulatory).                        - Resume regular diet.                        - Repeat colonoscopy in 3 years for  surveillance based                         on pathology results.                        - Return to my office. Procedure Code(s):     --- Professional ---                        [phone], Colonoscopy, flexible; with removal of                         tumor(s), polyp(s), or other lesion(s) by snare                         technique CPT copyright 2022 [employer]. All rights reserved. The codes documented in this report are preliminary and upon coder review may  be revised to meet current compliance requirements. Jayson MALVA Endow, MD 01/01/2024 10:12:31 AM Number of Addenda: 0 Note Initiated On: 01/01/2024 9:21 AM Scope Withdrawal Time: 0 hours 11 minutes 45 seconds  Total Procedure Duration: 0 hours 32 minutes 30 seconds  Estimated Blood Loss:  Estimated blood loss was minimal.      [facility]

## 2024-01-03 LAB — SURGICAL PATHOLOGY

## 2024-01-16 ENCOUNTER — Ambulatory Visit: Admitting: General Surgery

## 2024-01-16 ENCOUNTER — Encounter: Payer: Self-pay | Admitting: General Surgery

## 2024-01-16 VITALS — BP 112/74 | HR 86 | Ht 70.0 in | Wt 199.0 lb

## 2024-01-16 DIAGNOSIS — K642 Third degree hemorrhoids: Secondary | ICD-10-CM

## 2024-01-16 NOTE — Patient Instructions (Addendum)
 Follow-up with our office as needed.  Please call and ask to speak with a nurse if you develop questions or concerns.   Hemorrhoids Hemorrhoids are swollen veins that may form: In the butt (rectum). These are called internal hemorrhoids. Around the opening of the butt (anus). These are called external hemorrhoids. Most hemorrhoids do not cause very bad problems. They often get better with changes to your lifestyle and what you eat. What are the causes? Having trouble pooping (constipation) or watery poop (diarrhea). Pushing too hard when you poop. Pregnancy. Being very overweight (obese). Sitting for too long. Riding a bike for a long time. Heavy lifting or other things that take a lot of effort. Anal sex. What are the signs or symptoms? Pain. Itching or soreness in the butt. Bleeding from the butt. Leaking poop. Swelling. One or more lumps around the opening of your butt. How is this treated? In most cases, hemorrhoids can be treated at home. You may be told to: Change what you eat. Make changes to your lifestyle. If these treatments do not help, you may need to have a procedure done. Your doctor may need to: Place rubber bands at the bottom of the hemorrhoids to make them fall off. Put medicine into the hemorrhoids to shrink them. Shine a type of light on the hemorrhoids to cause them to fall off. Do surgery to get rid of the hemorrhoids. Follow these instructions at home: Medicines Take over-the-counter and prescription medicines only as told by your doctor. Use creams with medicine in them or medicines that you put in your butt as told by your doctor. Eating and drinking  Eat foods that have a lot of fiber in them. These include whole grains, beans, nuts, fruits, and vegetables. Ask your doctor about taking products that have fiber added to them (fibersupplements). Take in less fat. You can do this by: Eating low-fat dairy products. Eating less red meat. Staying away  from processed foods. Drink enough fluid to keep your pee (urine) pale yellow. Managing pain and swelling  Take a warm-water bath (sitz bath) for 20 minutes to ease pain. Do this 3-4 times a day. You may do this in a bathtub. You may also use a portable sitz bath that fits over the toilet. If told, put ice on the painful area. It may help to use ice between your warm baths. Put ice in a plastic bag. Place a towel between your skin and the bag. Leave the ice on for 20 minutes, 2-3 times a day. If your skin turns bright red, take off the ice right away to prevent skin damage. The risk of damage is higher if you cannot feel pain, heat, or cold. General instructions Exercise. Ask your doctor how much and what kind of exercise is best for you. Go to the bathroom when you need to poop. Do not wait. Try not to push too hard when you poop. Keep your butt dry and clean. Use wet toilet paper or moist towelettes after you poop. Do not sit on the toilet for a long time. Contact a doctor if: You have pain and swelling that do not get better with treatment. You have trouble pooping. You cannot poop. You have pain or swelling outside the area of the hemorrhoids. Get help right away if: You have bleeding from the butt that will not stop. This information is not intended to replace advice given to you by your health care provider. Make sure you discuss any questions you have  with your health care provider. Document Revised: 10/04/2021 Document Reviewed: 10/04/2021 Elsevier Patient Education  2024 [employer].

## 2024-01-20 NOTE — Progress Notes (Signed)
 Patient ID: Patrick Ramsey, male   DOB: 1970-07-07, 53 y.o.   MRN: [id number] CC: Hemorrhoids History of Present Illness Patrick Ramsey is a 53 y.o. male with past medical history as below who presents in consultation for hemorrhoids.  The patient recently underwent a colonoscopy with me and there is noted to be very large external hemorrhoids as well as internal hemorrhoids.  He reports that intermittently he will have bleeding.  He reports that his stool habits are normal he denies having to strain or prolonged sitting on the toilet.  He has never had any banding or procedures on his hemorrhoids.  He says that he does not have bleeding every time he uses the bathroom but it has happened several times per week.  He is not using any fiber supplementation.  His last labs did not show any anemia.  Past Medical History Past Medical History:  Diagnosis Date   Hypertension    Hyponatremia    suspect SIADH   SIADH (syndrome of inappropriate ADH production)    Smoker        Past Surgical History:  Procedure Laterality Date   COLONOSCOPY N/A 01/01/2024   Procedure: COLONOSCOPY;  Surgeon: Marinda Patrick KIDD, MD;  Location: [facility];  Service: General;  Laterality: N/A;   KNEE ARTHROSCOPY WITH MEDIAL MENISECTOMY Left 10/03/2017   Procedure: KNEE ARTHROSCOPY WITH MEDIAL MENISECTOMY DIAGNOSTIC;  Surgeon: Margrette Taft BRAVO, MD;  Location: [facility];  Service: Orthopedics;  Laterality: Left;   KNEE ARTHROSCOPY WITH MENISCAL REPAIR Left 10/10/2017   Procedure: KNEE ARTHROSCOPY WITH MEDIAL MENISCAL REPAIR;  Surgeon: Margrette Taft BRAVO, MD;  Location: [facility];  Service: Orthopedics;  Laterality: Left;   POLYPECTOMY  01/01/2024   Procedure: POLYPECTOMY, INTESTINE;  Surgeon: Marinda Patrick KIDD, MD;  Location: [facility] ENDOSCOPY;  Service: General;;    Allergies[1]  Current Outpatient Medications  Medication Sig Dispense Refill   amLODipine  (NORVASC ) 10 MG tablet Take 1 tablet (10 mg total) by mouth daily. 90  tablet 3   escitalopram  (LEXAPRO ) 10 MG tablet Take 1 tablet (10 mg total) by mouth daily. 30 tablet 5   sildenafil  (VIAGRA ) 100 MG tablet Take 1 tablet (100 mg total) by mouth daily as needed for erectile dysfunction. 10 tablet 11   valsartan  (DIOVAN ) 320 MG tablet Take 1 tablet (320 mg total) by mouth daily. *please make appt* 90 tablet 3   No current facility-administered medications for this visit.    Family History Family History  Problem Relation Age of Onset   Uterine cancer Mother    Hypertension Father    Colon cancer Maternal Grandmother        Social History Social History[2]      ROS Full ROS of systems performed and is otherwise negative there than what is stated in the HPI  Physical Exam Blood pressure 112/74, pulse 86, height 5' 10 (1.778 m), weight 199 lb (90.3 kg), SpO2 98%.  Alert and oriented x 3, no work of breathing room air, regular rate and rhythm, abdomen is soft, nontender nondistended, rectal exam performed in the presence of a chaperone.  On external exam there are large external hemorrhoids and skin tags, digital rectal exam without any gross blood or dominant masses, anoscopy performed and there are grade 2-3 hemorrhoidal columns Data Reviewed Reviewed colonoscopy report and patient had large sigmoid polyp and hemorrhoids noted  I have personally reviewed the patient's imaging and medical records.    Assessment    Patient with grade 2-3 internal  hemorrhoids as well as large external hemorrhoids.  He does have a little bit of bleeding although no evidence of anemia.  Plan    I did recommend that the patient undergo hemorrhoidectomy and discussed with him the risk, benefits alternatives of the procedure including risk of infection, bleeding, anal stenosis and incontinence.  I also discussed that there is some pain associated with this procedure.  At this time he would like to hold off on any surgical intervention.  He will need to get a repeat  colonoscopy in 3 years.  He can follow-up with us  as needed  A total of 45 minutes was spent reviewing the patient's chart, performing history and physical and discussing treatment options with the patient.  This is irrespective of the time spent doing a anoscopy  Patrick Ramsey      [1]  Allergies Allergen Reactions   Hydrochlorothiazide      Hyponatremia   [2]  Social History Tobacco Use   Smoking status: Every Day    Current packs/day: 1.00    Average packs/day: 1 pack/day for 37.3 years (37.3 ttl pk-yrs)    Types: Cigarettes    Start date: 10/17/1986    Passive exposure: Past   Smokeless tobacco: Never  Vaping Use   Vaping status: Never Used  Substance Use Topics   Alcohol use: Yes    Alcohol/week: 12.0 standard drinks of alcohol    Types: 12 Cans of beer per week    Comment: 12 cans on weekend   Drug use: No

## 2024-03-03 ENCOUNTER — Other Ambulatory Visit (HOSPITAL_COMMUNITY): Payer: Self-pay
